# Patient Record
Sex: Male | Born: 1965
Health system: Southern US, Community
[De-identification: ages and names within clinical notes are randomized; demographics above are authoritative.]

## PROBLEM LIST (undated history)

## (undated) DIAGNOSIS — I5031 Acute diastolic (congestive) heart failure: Secondary | ICD-10-CM

## (undated) DIAGNOSIS — I110 Hypertensive heart disease with heart failure: Secondary | ICD-10-CM

## (undated) DIAGNOSIS — I10 Essential (primary) hypertension: Secondary | ICD-10-CM

## (undated) HISTORY — PX: BRAIN SURGERY: SHX531

---

## 1999-02-20 ENCOUNTER — Emergency Department (HOSPITAL_COMMUNITY): Admission: EM | Admit: 1999-02-20 | Discharge: 1999-02-20 | Payer: Self-pay | Admitting: Emergency Medicine

## 2000-11-26 ENCOUNTER — Emergency Department (HOSPITAL_COMMUNITY): Admission: EM | Admit: 2000-11-26 | Discharge: 2000-11-26 | Payer: Self-pay | Admitting: *Deleted

## 2000-12-23 ENCOUNTER — Emergency Department (HOSPITAL_COMMUNITY): Admission: EM | Admit: 2000-12-23 | Discharge: 2000-12-23 | Payer: Self-pay

## 2001-12-14 ENCOUNTER — Emergency Department (HOSPITAL_COMMUNITY): Admission: EM | Admit: 2001-12-14 | Discharge: 2001-12-14 | Payer: Self-pay | Admitting: Emergency Medicine

## 2007-12-29 ENCOUNTER — Emergency Department (HOSPITAL_COMMUNITY): Admission: EM | Admit: 2007-12-29 | Discharge: 2007-12-29 | Payer: Self-pay | Admitting: Emergency Medicine

## 2012-02-26 ENCOUNTER — Emergency Department (INDEPENDENT_AMBULATORY_CARE_PROVIDER_SITE_OTHER)
Admission: EM | Admit: 2012-02-26 | Discharge: 2012-02-26 | Disposition: A | Discharge status: Home / Self Care | Payer: Self-pay | Source: Home / Self Care

## 2012-02-26 ENCOUNTER — Encounter (HOSPITAL_COMMUNITY): Payer: Self-pay | Admitting: Emergency Medicine

## 2012-02-26 ENCOUNTER — Emergency Department (INDEPENDENT_AMBULATORY_CARE_PROVIDER_SITE_OTHER): Payer: Self-pay

## 2012-02-26 DIAGNOSIS — J9801 Acute bronchospasm: Secondary | ICD-10-CM

## 2012-02-26 DIAGNOSIS — J45909 Unspecified asthma, uncomplicated: Secondary | ICD-10-CM

## 2012-02-26 DIAGNOSIS — J069 Acute upper respiratory infection, unspecified: Secondary | ICD-10-CM

## 2012-02-26 MED ORDER — ALBUTEROL SULFATE (5 MG/ML) 0.5% IN NEBU
INHALATION_SOLUTION | RESPIRATORY_TRACT | Status: AC
  Filled 2012-02-26: qty 0.5

## 2012-02-26 MED ORDER — TRIAMCINOLONE ACETONIDE 40 MG/ML IJ SUSP
INTRAMUSCULAR | Status: AC
  Filled 2012-02-26: qty 5

## 2012-02-26 MED ORDER — METHYLPREDNISOLONE 4 MG PO KIT: PACK | ORAL | Status: DC

## 2012-02-26 MED ORDER — TRIAMCINOLONE ACETONIDE 40 MG/ML IJ SUSP
40.0000 mg | Freq: Once | INTRAMUSCULAR | Status: AC
  Administered 2012-02-26: 40 mg via INTRAMUSCULAR

## 2012-02-26 MED ORDER — ALBUTEROL SULFATE (5 MG/ML) 0.5% IN NEBU
2.5000 mg | INHALATION_SOLUTION | Freq: Once | RESPIRATORY_TRACT | Status: AC
  Administered 2012-02-26: 2.5 mg via RESPIRATORY_TRACT

## 2012-02-26 MED ORDER — IPRATROPIUM BROMIDE 0.02 % IN SOLN
0.5000 mg | Freq: Once | RESPIRATORY_TRACT | Status: AC
  Administered 2012-02-26: 0.5 mg via RESPIRATORY_TRACT

## 2012-02-26 MED ORDER — ALBUTEROL SULFATE HFA 108 (90 BASE) MCG/ACT IN AERS: 2.0000 | INHALATION_SPRAY | RESPIRATORY_TRACT | Status: DC | PRN

## 2012-02-26 NOTE — ED Notes (Addendum)
Pt c/o SOB x3 days... Sx include: occasional cough, fevers, chest discomfort w/activity... Denies: nauseas, vomiting, diarrhea, blurry vision, edema... Pt is alert w/some discomfort due to SOB... No hx of asthma or HTN.

## 2012-02-26 NOTE — ED Provider Notes (Signed)
History     CSN: [id number]  Arrival date & time 02/26/12  1813   None     Chief Complaint  Patient presents with  . Shortness of Breath    (Consider location/radiation/quality/duration/timing/severity/associated sxs/prior treatment) HPI Comments: 46 year old male who developed coughing congestion approximately 2 days ago. The cough has improved somewhat however yesterday he purchased some Mucinex and after taking that he developed shortness of breath which has persisted through today. Shortness of breath is getting worse it occurs at rest and especially with exertion. He states that fever 101 yesterday but the cough is better today. He denies any chest pain, denies heaviness, tightness, pressure, or fullness. Although some of his symptoms that occurred initially have improved his chief complaint is dyspnea. Initial pulse ox was 93% he was repeated moments later and was 97%   History reviewed. No pertinent past medical history.  Past Surgical History  Procedure Date  . Brain surgery     No family history on file.  History  Substance Use Topics  . Smoking status: Current Every Day Smoker -- 1.0 packs/day    Types: Cigars  . Smokeless tobacco: Not on file  . Alcohol Use: Yes      Review of Systems  Constitutional: Positive for fever and activity change. Negative for diaphoresis and fatigue.  HENT: Negative for ear pain, sore throat, facial swelling, rhinorrhea, trouble swallowing, neck pain, neck stiffness and postnasal drip.   Eyes: Negative for pain, discharge and redness.  Respiratory: Positive for cough and shortness of breath. Negative for chest tightness.   Cardiovascular: Negative.  Negative for chest pain, palpitations and leg swelling.  Gastrointestinal: Negative.   Musculoskeletal: Negative.   Neurological: Negative.     Allergies  Review of patient's allergies indicates no known allergies.  Home Medications   Current Outpatient Rx  Name  Route  Sig   Dispense  Refill  . ALBUTEROL SULFATE HFA 108 (90 BASE) MCG/ACT IN AERS   Inhalation   Inhale 2 puffs into the lungs every 4 (four) hours as needed for wheezing.   1 Inhaler   0   . METHYLPREDNISOLONE 4 MG PO KIT      As directed   21 tablet   0     BP 164/98  Pulse 85  Temp 99.9 F (37.7 C) (Oral)  Resp 22  SpO2 97%  Physical Exam  Constitutional: He is oriented to person, place, and time. He appears well-developed and well-nourished. No distress.  HENT:  Right Ear: External ear normal.       TMs are pearly gray, trans-lucent, no bulging retraction Oropharynx is mildly erythematous with copious amount of thick gray PND.  Eyes: Conjunctivae normal and EOM are normal.  Neck: Normal range of motion. Neck supple.  Cardiovascular: Normal rate and regular rhythm.   Pulmonary/Chest: He has wheezes. He has no rales.       Mild increase in respiratory effort. Expiratory phase is prolonged with diminished breath sounds and wheezing  Musculoskeletal: Normal range of motion. He exhibits no edema.  Lymphadenopathy:    He has no cervical adenopathy.  Neurological: He is alert and oriented to person, place, and time.  Skin: Skin is warm and dry. No rash noted.  Psychiatric: He has a normal mood and affect.    ED Course  Procedures (including critical care time)  Labs Reviewed - No data to display Dg Chest 2 View  02/26/2012  *RADIOLOGY REPORT*  Clinical Data: Shortness of breath, cough.  CHEST - 2 VIEW  Comparison: None.  Findings: Heart and mediastinal contours are within normal limits. No focal opacities or effusions.  No acute bony abnormality.  IMPRESSION: No active cardiopulmonary disease.   Original Report Authenticated By: Charlett Nose, M.D.      1. Bronchospasm   2. Reactive airway disease with wheezing   3. URI (upper respiratory infection)       MDM  Albuterol and Atrovent nebulizer Post neb evaluation reveals moderate improvement in breathing and reduction of  wheezing. He does not have a history of asthma however he does smoke a cigar once a day. He is advised to stop smoking altogether. I suspect he has some reactive airway disease causing most of his symptoms tonight. Chest x-ray is clear. Kenalog 40 mg IM now Albuterol HFA 2 puffs q. 6 hours when necessary cough and we Medrol Dosepak start tomorrow Oral your physician as directed. Return if worse Blood pressure is elevated but he did come down some on the repeat measurement. Is advised follow up with his physician for evaluation of hypertension.        Hayden Rasmussen, NP 02/26/12 1932  Hayden Rasmussen, NP 02/26/12 [phone]

## 2012-02-28 ENCOUNTER — Emergency Department (HOSPITAL_COMMUNITY): Payer: Self-pay

## 2012-02-28 ENCOUNTER — Encounter (HOSPITAL_COMMUNITY): Payer: Self-pay | Admitting: *Deleted

## 2012-02-28 ENCOUNTER — Emergency Department (INDEPENDENT_AMBULATORY_CARE_PROVIDER_SITE_OTHER)
Admission: EM | Admit: 2012-02-28 | Discharge: 2012-02-28 | Disposition: A | Discharge status: Nursing Home (Includes ICF) | Payer: Self-pay | Source: Home / Self Care | Attending: Family Medicine | Admitting: Family Medicine

## 2012-02-28 ENCOUNTER — Emergency Department (HOSPITAL_COMMUNITY)
Admission: EM | Admit: 2012-02-28 | Discharge: 2012-02-28 | Disposition: A | Discharge status: Home / Self Care | Payer: Self-pay | Attending: Emergency Medicine | Admitting: Emergency Medicine

## 2012-02-28 DIAGNOSIS — R109 Unspecified abdominal pain: Secondary | ICD-10-CM

## 2012-02-28 DIAGNOSIS — R52 Pain, unspecified: Secondary | ICD-10-CM

## 2012-02-28 DIAGNOSIS — R1013 Epigastric pain: Secondary | ICD-10-CM

## 2012-02-28 DIAGNOSIS — I10 Essential (primary) hypertension: Secondary | ICD-10-CM

## 2012-02-28 DIAGNOSIS — Z8719 Personal history of other diseases of the digestive system: Secondary | ICD-10-CM | POA: Insufficient documentation

## 2012-02-28 DIAGNOSIS — M549 Dorsalgia, unspecified: Secondary | ICD-10-CM | POA: Insufficient documentation

## 2012-02-28 DIAGNOSIS — Z79899 Other long term (current) drug therapy: Secondary | ICD-10-CM | POA: Insufficient documentation

## 2012-02-28 DIAGNOSIS — F17200 Nicotine dependence, unspecified, uncomplicated: Secondary | ICD-10-CM | POA: Insufficient documentation

## 2012-02-28 DIAGNOSIS — I169 Hypertensive crisis, unspecified: Secondary | ICD-10-CM

## 2012-02-28 HISTORY — DX: Essential (primary) hypertension: I10

## 2012-02-28 LAB — POCT I-STAT, CHEM 8
BUN: 18 mg/dL (ref 6–23)
Creatinine, Ser: 1 mg/dL (ref 0.50–1.35)
Glucose, Bld: 136 mg/dL — ABNORMAL HIGH (ref 70–99)
Hemoglobin: 17 g/dL (ref 13.0–17.0)
Potassium: 3.9 mEq/L (ref 3.5–5.1)
Sodium: 144 mEq/L (ref 135–145)

## 2012-02-28 LAB — POCT URINALYSIS DIP (DEVICE)
Bilirubin Urine: NEGATIVE
Nitrite: NEGATIVE
Protein, ur: 100 mg/dL — AB
Specific Gravity, Urine: >=1.03 (ref 1.005–1.030)

## 2012-02-28 LAB — COMPREHENSIVE METABOLIC PANEL
ALT: 45 U/L (ref 0–53)
ALT: 42 U/L (ref 0–53)
AST: 53 U/L — ABNORMAL HIGH (ref 0–37)
AST: 48 U/L — ABNORMAL HIGH (ref 0–37)
Albumin: 4.1 g/dL (ref 3.5–5.2)
BUN: 16 mg/dL (ref 6–23)
Chloride: 104 mEq/L (ref 96–112)
GFR calc Af Amer: >90 mL/min (ref >90)
GFR calc non Af Amer: >90 mL/min (ref >90)
Glucose, Bld: 142 mg/dL — ABNORMAL HIGH (ref 70–99)
Potassium: 3.6 mEq/L (ref 3.5–5.1)
Potassium: 3.7 mEq/L (ref 3.5–5.1)
Sodium: 142 mEq/L (ref 135–145)
Total Bilirubin: 0.2 mg/dL — ABNORMAL LOW (ref 0.3–1.2)
Total Protein: 7.3 g/dL (ref 6.0–8.3)

## 2012-02-28 LAB — CBC: Hemoglobin: 15.3 g/dL (ref 13.0–17.0)

## 2012-02-28 LAB — OCCULT BLOOD, POC DEVICE: Fecal Occult Bld: NEGATIVE

## 2012-02-28 LAB — TROPONIN I: Troponin I: <0.3 ng/mL (ref <0.30)

## 2012-02-28 MED ORDER — IOHEXOL 300 MG/ML  SOLN
100.0000 mL | Freq: Once | INTRAMUSCULAR | Status: AC | PRN
  Administered 2012-02-28: 100 mL via INTRAVENOUS

## 2012-02-28 MED ORDER — HYDROMORPHONE HCL PF 1 MG/ML IJ SOLN
INTRAMUSCULAR | Status: AC
  Filled 2012-02-28: qty 1

## 2012-02-28 MED ORDER — HYDROMORPHONE HCL PF 1 MG/ML IJ SOLN
1.0000 mg | Freq: Once | INTRAMUSCULAR | Status: AC
  Administered 2012-02-28: 1 mg via INTRAVENOUS
  Filled 2012-02-28: qty 1

## 2012-02-28 MED ORDER — GI COCKTAIL ~~LOC~~
ORAL | Status: AC
  Filled 2012-02-28: qty 30

## 2012-02-28 MED ORDER — ONDANSETRON HCL 4 MG/2ML IJ SOLN
INTRAMUSCULAR | Status: AC
  Filled 2012-02-28: qty 2

## 2012-02-28 MED ORDER — HYDROMORPHONE HCL 1 MG/ML IJ SOLN
0.5000 mg | Freq: Once | INTRAMUSCULAR | Status: AC
  Administered 2012-02-28: 0.5 mg via INTRAVENOUS

## 2012-02-28 MED ORDER — IOHEXOL 300 MG/ML  SOLN
20.0000 mL | INTRAMUSCULAR | Status: AC
  Administered 2012-02-28 (×2): 20 mL via ORAL

## 2012-02-28 MED ORDER — OXYCODONE-ACETAMINOPHEN 5-325 MG PO TABS: 1.0000 | ORAL_TABLET | Freq: Four times a day (QID) | ORAL | Status: DC | PRN

## 2012-02-28 MED ORDER — SODIUM CHLORIDE 0.9 % IV BOLUS (SEPSIS)
1000.0000 mL | Freq: Once | INTRAVENOUS | Status: AC
  Administered 2012-02-28: 1000 mL via INTRAVENOUS

## 2012-02-28 MED ORDER — HYDROMORPHONE HCL 1 MG/ML IJ SOLN: 0.5000 mg | Freq: Once | INTRAMUSCULAR | Status: DC

## 2012-02-28 MED ORDER — ONDANSETRON HCL 4 MG/2ML IJ SOLN
4.0000 mg | Freq: Once | INTRAMUSCULAR | Status: AC
  Administered 2012-02-28: 4 mg via INTRAVENOUS

## 2012-02-28 MED ORDER — ONDANSETRON 4 MG PO TBDP: 4.0000 mg | ORAL_TABLET | Freq: Once | ORAL | Status: DC

## 2012-02-28 MED ORDER — MORPHINE SULFATE 4 MG/ML IJ SOLN: 4.0000 mg | Freq: Once | INTRAMUSCULAR | Status: DC

## 2012-02-28 NOTE — ED Notes (Signed)
Pt    Feels  Somewhat  Better          Remains  Npo

## 2012-02-28 NOTE — ED Notes (Signed)
SPOKE WITH DANA IN CT. SHE ADVISES SHE HAS PUT PT IN SYSTEM TO BE TRANSPORTED TO CT

## 2012-02-28 NOTE — ED Provider Notes (Signed)
CT results reviewed, discussed with Dr. Deretha Emory, shared with patient.  Patient tolerating po fluids without difficulty. Plan is to discharge patient home with instructions for clear liquid diet for 12-24 hours, and progression of diet as tolerated.  GI referral provided.  Jimmye Norman, NP 02/28/12 2216

## 2012-02-28 NOTE — ED Notes (Signed)
Iv  Ns  tko  20  Angio  r  Hand  1  Att     Site  patent

## 2012-02-28 NOTE — ED Notes (Signed)
np aware pt complaint of pain

## 2012-02-28 NOTE — ED Notes (Signed)
Patient states no real nausea/vomiting or diarrhea, but had horrible 10/10 abdominal pain this morning that radiate to back.

## 2012-02-28 NOTE — ED Provider Notes (Signed)
History     CSN: [id number]  Arrival date & time 02/28/12  1207   First MD Initiated Contact with Patient 02/28/12 1257      Chief Complaint  Patient presents with  . Abdominal Pain    (Consider location/radiation/quality/duration/timing/severity/associated sxs/prior treatment) HPI Comments: Pt presents from [facility] for further evaluation of acute sudden onset of epigastric abdominal pain at 8:30 AM after taking prednisone (tues dx w bronchitis associated w SOB, started taper yesterday. Given albuterol inhaler as well). Last meal was cereal this morning. Pt also recently diagnosed w HTN, not yet evaluated by PCP or on medication. LNBM this morning.   Patient is a 46 y.o. male presenting with abdominal pain. The history is provided by the patient.  Abdominal Pain The primary symptoms of the illness include abdominal pain. The primary symptoms of the illness do not include fever, fatigue, shortness of breath, nausea, vomiting, diarrhea, hematemesis, hematochezia, dysuria, vaginal discharge or vaginal bleeding. The current episode started 3 to 5 hours ago. The onset of the illness was sudden. The problem has not changed since onset. The illness is associated with alcohol use. Additional symptoms associated with the illness include back pain. Symptoms associated with the illness do not include chills, anorexia, diaphoresis, heartburn, constipation, urgency, hematuria or frequency. Significant associated medical issues include GERD. Significant associated medical issues do not include PUD, inflammatory bowel disease, diabetes, sickle cell disease, gallstones, liver disease, substance abuse, diverticulitis, HIV or cardiac disease.    Past Medical History  Diagnosis Date  . Hypertension     Past Surgical History  Procedure Date  . Brain surgery     History reviewed. No pertinent family history.  History  Substance Use Topics  . Smoking status: Current Every Day Smoker -- 1.0 packs/day     Types: Cigars  . Smokeless tobacco: Not on file  . Alcohol Use: Yes      Review of Systems  Constitutional: Negative for fever, chills, diaphoresis, appetite change and fatigue.  HENT: Negative for congestion.   Eyes: Negative for visual disturbance.  Respiratory: Negative for shortness of breath.   Cardiovascular: Negative for chest pain and leg swelling.  Gastrointestinal: Positive for abdominal pain. Negative for heartburn, nausea, vomiting, diarrhea, constipation, hematochezia, anorexia and hematemesis.  Genitourinary: Negative for dysuria, urgency, frequency, hematuria, vaginal bleeding and vaginal discharge.  Musculoskeletal: Positive for back pain.  Neurological: Negative for dizziness, syncope, weakness, light-headedness, numbness and headaches.  Psychiatric/Behavioral: Negative for confusion.  All other systems reviewed and are negative.    Allergies  Review of patient's allergies indicates no known allergies.  Home Medications   Current Outpatient Rx  Name  Route  Sig  Dispense  Refill  . ALBUTEROL SULFATE HFA 108 (90 BASE) MCG/ACT IN AERS   Inhalation   Inhale 2 puffs into the lungs every 4 (four) hours as needed for wheezing.   1 Inhaler   0   . GUAIFENESIN ER 600 MG PO TB12   Oral   Take 600 mg by mouth 2 (two) times daily as needed. congestion         . METHYLPREDNISOLONE 4 MG PO KIT      As directed   21 tablet   0     BP 147/106  Pulse 70  Temp 98.2 F (36.8 C) (Oral)  Resp 20  SpO2 93%  Physical Exam  Nursing note and vitals reviewed. Constitutional: Vital signs are normal. He appears well-developed and well-nourished. He appears distressed.  HENT:  Head:  Normocephalic and atraumatic.  Mouth/Throat: Uvula is midline, oropharynx is clear and moist and mucous membranes are normal.  Eyes: Conjunctivae normal and EOM are normal. Pupils are equal, round, and reactive to light.  Neck: Normal range of motion and full passive range of motion  without pain. Neck supple. No spinous process tenderness and no muscular tenderness present. No rigidity. No Brudzinski's sign noted.  Cardiovascular: Normal rate and regular rhythm.   Pulmonary/Chest: Effort normal and breath sounds normal. No accessory muscle usage. Not tachypneic. No respiratory distress.  Abdominal: Soft. Normal appearance. He exhibits no distension, no ascites, no pulsatile midline mass and no mass. There is tenderness. There is no CVA tenderness. No hernia.       Moderate epigastric tenderness to palpation. No masses or pulsatile aorta  Genitourinary: Guaiac negative stool (Per U[facility]).  Lymphadenopathy:    He has no cervical adenopathy.  Neurological: He is alert.  Skin: Skin is warm and dry. No rash noted. He is not diaphoretic.  Psychiatric: He has a normal mood and affect. His speech is normal and behavior is normal.    ED Course  Procedures (including critical care time)  Labs Reviewed  COMPREHENSIVE METABOLIC PANEL - Abnormal; Notable for the following:    Glucose, Bld 127 (*)     AST 48 (*)     Total Bilirubin 0.2 (*)     All other components within normal limits  LIPASE, BLOOD - Abnormal; Notable for the following:    Lipase 83 (*)     All other components within normal limits  CBC - Abnormal; Notable for the following:    RDW 15.8 (*)     All other components within normal limits  TROPONIN I   Dg Chest 2 View  02/26/2012  *RADIOLOGY REPORT*  Clinical Data: Shortness of breath, cough.  CHEST - 2 VIEW  Comparison: None.  Findings: Heart and mediastinal contours are within normal limits. No focal opacities or effusions.  No acute bony abnormality.  IMPRESSION: No active cardiopulmonary disease.   Original Report Authenticated By: Charlett Nose, M.D.    US Abdomen Complete  02/28/2012  *RADIOLOGY REPORT*  Clinical Data:  Epigastric pain.  ABDOMINAL ULTRASOUND COMPLETE  Comparison:  None.  Findings:  Gallbladder:  No gallstones, gallbladder wall thickening,  or pericholecystic fluid. The sonographic Murphy's sign is negative.  Common Bile Duct:  Within normal limits in caliber.  Liver: No focal mass lesion identified.  Within normal limits in parenchymal echogenicity. The portal vein is patent and demonstrates normal direction of flow.  IVC:  Appears normal.  Pancreas:  No abnormality identified.  Spleen:  Within normal limits in size and echotexture.  Right kidney:  Normal in size and parenchymal echogenicity.  No evidence of mass or hydronephrosis.  Left kidney:  Normal in size and parenchymal echogenicity.  No evidence hydronephrosis. There is a 1.7 x 1.4 x 1.6 cm cyst in the cortex of the mid pole.  Abdominal Aorta:  No aneurysm identified.  IMPRESSION:  1.  No acute findings.  No explanation for patient's epigastric pain is identified. 2.  Incidental 1.7 cm left renal cyst.   Original Report Authenticated By: Britta Mccreedy, M.D.     Date: 02/28/2012  Rate: 60  Rhythm: normal sinus rhythm  QRS Axis: normal  Intervals: normal  ST/T Wave abnormalities: normal  Conduction Disutrbances: none  Narrative Interpretation:   Old EKG Reviewed: No significant changes noted     No diagnosis found.    MDM  Epigastric abd pain, hypertension   Pt w recent dx of HTN presents to ER c/o acute onset epigastric pain. Labs adn imaging reviewed this far showing elevated lipase. No indication of biliary etiology on [place]. CT abd pelvis pending for further evaluation of possible pancreatic etiology vs prednisone induced gastritis/pancreatitis. Pt is afebrile with BP 147/106  Pulse 70  Temp 98.2 F (36.8 C) (Oral)  Resp 20  SpO2 93%. Pain being managed in the ER. Transfer to CDU pending disposition once resulted. CAse discussed with CDU provider which is agreeable with  Move.         Jaci Carrel, [place] 02/28/12 1431

## 2012-02-28 NOTE — ED Notes (Signed)
Pt  Reports    Severe  Epigastric /  abd  Pain  Which  Started  This  Am           Pt  Reports  The  Pain radiates  To  His  Back          He  Was  Seen  sev  Days  Ago  For  Bronchitis             Pt       denys  Any       Nausea  Vomiting   Or  Diarrhea         He  Has  A   History     Of       htn  But     He       Takes  No  meds

## 2012-02-28 NOTE — ED Notes (Signed)
Patient sent from [facility] for abd pain.  Had 0.5 of Dilaudid and 4 mg of Zofran IV with no relief at [facility].  Sent for further eval of abd pain.  Patient states "pain did not start until I took a Prednisone".

## 2012-02-28 NOTE — ED Provider Notes (Signed)
History     CSN: [id number]  Arrival date & time 02/28/12  1012   First MD Initiated Contact with Patient 02/28/12 1024      Chief Complaint  Patient presents with  . Abdominal Pain    (Consider location/radiation/quality/duration/timing/severity/associated sxs/prior treatment) HPI Comments: 46 year old smoker male smoker with history of high blood pressure. Here complaining of epigastric pain constant for about an hour. Patient was diagnosed with bronchitis 2 days ago and has been taking prednisone for the last 2 days. He states his pain started 30 minutes after his prednisone dose this morning pain is stabbing and was associated with a cold sweat and nausea initially. No diaphoresis currently. . Denies recent alcohol intake. Denies nausea or vomiting. Last bowel movement was this morning brown stools with no blood or melena. No dysuria or hematuria. States epigastric pain radiates to the back. No prior history of pancreatitis or gallbladder stones. Patient does not currently take any medications for blood pressure. Denies headache, visual changes, chest pain or shortness of breath.   Past Medical History  Diagnosis Date  . Hypertension     Past Surgical History  Procedure Date  . Brain surgery     No family history on file.  History  Substance Use Topics  . Smoking status: Current Every Day Smoker -- 1.0 packs/day    Types: Cigars  . Smokeless tobacco: Not on file  . Alcohol Use: Yes      Review of Systems  Constitutional: Negative for fever, chills and diaphoresis.  Eyes: Negative for visual disturbance.  Respiratory: Negative for cough and shortness of breath.   Cardiovascular: Negative for chest pain, palpitations and leg swelling.  Gastrointestinal: Positive for nausea and abdominal pain. Negative for vomiting, diarrhea and blood in stool.  Genitourinary: Negative for dysuria, hematuria and flank pain.  Skin: Negative for rash.  Neurological: Negative for  dizziness, weakness, numbness and headaches.    Allergies  Review of patient's allergies indicates no known allergies.  Home Medications   Current Outpatient Rx  Name  Route  Sig  Dispense  Refill  . ALBUTEROL SULFATE HFA 108 (90 BASE) MCG/ACT IN AERS   Inhalation   Inhale 2 puffs into the lungs every 4 (four) hours as needed for wheezing.   1 Inhaler   0   . METHYLPREDNISOLONE 4 MG PO KIT      As directed   21 tablet   0     BP 198/116  Pulse 88  Temp 99.2 F (37.3 C)  SpO2 100%  Physical Exam  Nursing note and vitals reviewed. Constitutional: He is oriented to person, place, and time. He appears well-developed and well-nourished.       Uncomfortable bending due to pain.  HENT:  Head: Normocephalic and atraumatic.  Mouth/Throat: Oropharynx is clear and moist.  Eyes: Conjunctivae normal are normal. No scleral icterus.  Neck: Neck supple. No JVD present. No thyromegaly present.  Cardiovascular: Normal heart sounds.   Pulmonary/Chest: Breath sounds normal.  Abdominal: Soft. He exhibits no mass.       Obese abdomen. Severe epigastric tenderness with voluntary guarding. Otherwise rest of abdomen is soft with no TTP and no rebound. Decreased bowel sounds. No pulsatile mass.  Genitourinary: Rectum normal. Guaiac negative stool.  Neurological: He is alert and oriented to person, place, and time.  Skin: No rash noted.    ED Course  Procedures (including critical care time)  Labs Reviewed  POCT URINALYSIS DIP (DEVICE) - Abnormal; Notable for the  following:    Hgb urine dipstick SMALL (*)     Protein, ur 100 (*)     All other components within normal limits  OCCULT BLOOD, POC DEVICE  LIPASE, BLOOD  COMPREHENSIVE METABOLIC PANEL      1. Acute epigastric pain   2. Hypertensive crisis    EKG: Sinus bradycardia with a ventricular rate 59 beats per minutes no acute ischemic changes.   MDM  46 year old male smoker with history of hypertension. Not on  antihypertensive medications. Here complaining of constant severe epigastric pain for over an hour. Pain associated with cold sweat, nausea but no vomiting. No diarrhea no melena.On exam: Uncomfortable with pain. Hypertensive with a blood pressure in 198/116, pulse in the 80s. Abdomen severe epigastric tenderness with voluntary guarding. Rest of the abdomen is soft with no rebound, no masses. Guaiac negative. Possible steroid related gastritis but pain is persistent with no improvement of pain after GI cocktail. Patient very uncomfortable and hypertensive concerning for aortic pathology. Decided to transfer to the emergency department for further evaluation and management. Patient had a lot at 0.5 mg IV x1 and ondansetron 4 mg IV x1 prior to transfer to the emergency department. I stat and lipase pending at the time of discharge.         Sharin Grave, MD 02/28/12 [phone]

## 2012-02-29 NOTE — ED Provider Notes (Signed)
Medical screening examination/treatment/procedure(s) were performed by resident physician or non-physician practitioner and as supervising physician I was immediately available for consultation/collaboration.   KINDL,JAMES DOUGLAS MD.    James D Kindl, MD 02/29/12 1127 

## 2012-02-29 NOTE — ED Provider Notes (Signed)
Medical screening examination/treatment/procedure(s) were performed by non-physician practitioner and as supervising physician I was immediately available for consultation/collaboration.   Joya Gaskins, MD 02/29/12 [phone]

## 2012-03-01 NOTE — ED Provider Notes (Signed)
Medical screening examination/treatment/procedure(s) were conducted as a shared visit with non-physician practitioner(s) and myself.  I personally evaluated the patient during the encounter  Patient seen and findings reviewed. Stable for discharge.  Results for orders placed during the hospital encounter of 02/28/12  COMPREHENSIVE METABOLIC PANEL      Component Value Range   Sodium 142  135 - 145 mEq/L   Potassium 3.7  3.5 - 5.1 mEq/L   Chloride 104  96 - 112 mEq/L   CO2 28  19 - 32 mEq/L   Glucose, Bld 127 (*) 70 - 99 mg/dL   BUN 17  6 - 23 mg/dL   Creatinine, Ser 1.61  0.50 - 1.35 mg/dL   Calcium 9.3  8.4 - 09.6 mg/dL   Total Protein 7.3  6.0 - 8.3 g/dL   Albumin 3.8  3.5 - 5.2 g/dL   AST 48 (*) 0 - 37 U/L   ALT 42  0 - 53 U/L   Alkaline Phosphatase 86  39 - 117 U/L   Total Bilirubin 0.2 (*) 0.3 - 1.2 mg/dL   GFR calc non Af Amer >90  >90 mL/min   GFR calc Af Amer >90  >90 mL/min  LIPASE, BLOOD      Component Value Range   Lipase 83 (*) 11 - 59 U/L  CBC      Component Value Range   WBC 5.0  4.0 - 10.5 K/uL   RBC 5.13  4.22 - 5.81 MIL/uL   Hemoglobin 15.3  13.0 - 17.0 g/dL   HCT 04.5  40.9 - 81.1 %   MCV 90.1  78.0 - 100.0 fL   MCH 29.8  26.0 - 34.0 pg   MCHC 33.1  30.0 - 36.0 g/dL   RDW 91.4 (*) 78.2 - 95.6 %   Platelets 247  150 - 400 K/uL  TROPONIN I      Component Value Range   Troponin I <0.30  <0.30 ng/mL   Results for orders placed during the hospital encounter of 02/28/12  COMPREHENSIVE METABOLIC PANEL      Component Value Range   Sodium 142  135 - 145 mEq/L   Potassium 3.7  3.5 - 5.1 mEq/L   Chloride 104  96 - 112 mEq/L   CO2 28  19 - 32 mEq/L   Glucose, Bld 127 (*) 70 - 99 mg/dL   BUN 17  6 - 23 mg/dL   Creatinine, Ser 2.13  0.50 - 1.35 mg/dL   Calcium 9.3  8.4 - 08.6 mg/dL   Total Protein 7.3  6.0 - 8.3 g/dL   Albumin 3.8  3.5 - 5.2 g/dL   AST 48 (*) 0 - 37 U/L   ALT 42  0 - 53 U/L   Alkaline Phosphatase 86  39 - 117 U/L   Total Bilirubin 0.2 (*)  0.3 - 1.2 mg/dL   GFR calc non Af Amer >90  >90 mL/min   GFR calc Af Amer >90  >90 mL/min  LIPASE, BLOOD      Component Value Range   Lipase 83 (*) 11 - 59 U/L  CBC      Component Value Range   WBC 5.0  4.0 - 10.5 K/uL   RBC 5.13  4.22 - 5.81 MIL/uL   Hemoglobin 15.3  13.0 - 17.0 g/dL   HCT 57.8  46.9 - 62.9 %   MCV 90.1  78.0 - 100.0 fL   MCH 29.8  26.0 - 34.0 pg  MCHC 33.1  30.0 - 36.0 g/dL   RDW 40.9 (*) 81.1 - 91.4 %   Platelets 247  150 - 400 K/uL  TROPONIN I      Component Value Range   Troponin I <0.30  <0.30 ng/mL   Dg Chest 2 View  02/26/2012  *RADIOLOGY REPORT*  Clinical Data: Shortness of breath, cough.  CHEST - 2 VIEW  Comparison: None.  Findings: Heart and mediastinal contours are within normal limits. No focal opacities or effusions.  No acute bony abnormality.  IMPRESSION: No active cardiopulmonary disease.   Original Report Authenticated By: Charlett Nose, M.D.    US Abdomen Complete  02/28/2012  *RADIOLOGY REPORT*  Clinical Data:  Epigastric pain.  ABDOMINAL ULTRASOUND COMPLETE  Comparison:  None.  Findings:  Gallbladder:  No gallstones, gallbladder wall thickening, or pericholecystic fluid. The sonographic Murphy's sign is negative.  Common Bile Duct:  Within normal limits in caliber.  Liver: No focal mass lesion identified.  Within normal limits in parenchymal echogenicity. The portal vein is patent and demonstrates normal direction of flow.  IVC:  Appears normal.  Pancreas:  No abnormality identified.  Spleen:  Within normal limits in size and echotexture.  Right kidney:  Normal in size and parenchymal echogenicity.  No evidence of mass or hydronephrosis.  Left kidney:  Normal in size and parenchymal echogenicity.  No evidence hydronephrosis. There is a 1.7 x 1.4 x 1.6 cm cyst in the cortex of the mid pole.  Abdominal Aorta:  No aneurysm identified.  IMPRESSION:  1.  No acute findings.  No explanation for patient's epigastric pain is identified. 2.  Incidental 1.7 cm  left renal cyst.   Original Report Authenticated By: Britta Mccreedy, M.D.    Ct Abdomen Pelvis W Contrast  02/28/2012  *RADIOLOGY REPORT*  Clinical Data: Epigastric abdominal pain.  Recent shortness of breath.  CT ABDOMEN AND PELVIS WITH CONTRAST  Technique:  Multidetector CT imaging of the abdomen and pelvis was performed following the standard protocol during bolus administration of intravenous contrast.  Contrast: OMNIPAQUE IOHEXOL 300 MG/ML  SOLN  Comparison: 02/28/2012 ultrasound.  Findings: Slight left ventricular prominence noted with apparent apical thinning, correlate with cardiac history.  Nonspecific 7 mm hypodense lesion, segment three of the liver.  Nonspecific 4 mm hypodense lesion, segment eight of the liver.  The spleen, pancreas, and adrenal glands appear normal.  Dilated extrahepatic biliary system noted.  Somewhat fusiform prominence of the common hepatic duct is suspected up to 1.8 cm, and the common bile duct measures about 0.7 cm in diameter.  Hypodense left mid kidney lesion, 1.6 cm, likely a cyst based on low density.  Kidneys otherwise unremarkable.  No pathologic retroperitoneal or porta hepatis adenopathy is identified.  Stomach and duodenum unremarkable.  No hiatal hernia noted.  The appendix appears normal.  No pathologic pelvic adenopathy is identified.  No ventral hernia noted.  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  There is incidental failure of fusion of the posterior elements of T12 along with mild degenerative posterior subluxation at L5-S1.  IMPRESSION:  1.  Dilated common bile duct and common hepatic duct, without intrahepatic biliary dilatation.  No directly visualized choledocholithiasis.  The appearance could be due to low-level distal obstruction or a type 1 choledochal cyst. 2.  Mild left ventricular prominence noted with apical thinning, correlate with cardiac history in determining the need for further workup.  3.  There are two hypodense lesions in the  liver which are statistically highly likely to be benign, but technically nonspecific due to small size. 4.  Small left renal cyst. 5.  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 6.  Mild degenerative posterior subluxation at L5 S1.   Original Report Authenticated By: Gaylyn Rong, M.D.       Shelda Jakes, MD 03/01/12 1201

## 2014-10-29 ENCOUNTER — Emergency Department (INDEPENDENT_AMBULATORY_CARE_PROVIDER_SITE_OTHER)
Admission: EM | Admit: 2014-10-29 | Discharge: 2014-10-29 | Disposition: A | Discharge status: Home / Self Care | Payer: Self-pay | Source: Home / Self Care | Attending: Family Medicine | Admitting: Family Medicine

## 2014-10-29 ENCOUNTER — Encounter (HOSPITAL_COMMUNITY): Payer: Self-pay | Admitting: Emergency Medicine

## 2014-10-29 ENCOUNTER — Emergency Department (INDEPENDENT_AMBULATORY_CARE_PROVIDER_SITE_OTHER): Payer: Self-pay

## 2014-10-29 DIAGNOSIS — J45901 Unspecified asthma with (acute) exacerbation: Secondary | ICD-10-CM

## 2014-10-29 MED ORDER — IPRATROPIUM BROMIDE 0.02 % IN SOLN
0.5000 mg | Freq: Once | RESPIRATORY_TRACT | Status: AC
  Administered 2014-10-29: 0.5 mg via RESPIRATORY_TRACT

## 2014-10-29 MED ORDER — METHYLPREDNISOLONE SODIUM SUCC 125 MG IJ SOLR
125.0000 mg | Freq: Once | INTRAMUSCULAR | Status: AC
  Administered 2014-10-29: 125 mg via INTRAMUSCULAR

## 2014-10-29 MED ORDER — ALBUTEROL SULFATE (2.5 MG/3ML) 0.083% IN NEBU
INHALATION_SOLUTION | RESPIRATORY_TRACT | Status: AC
  Filled 2014-10-29: qty 3

## 2014-10-29 MED ORDER — METHYLPREDNISOLONE SODIUM SUCC 125 MG IJ SOLR
INTRAMUSCULAR | Status: AC
  Filled 2014-10-29: qty 2

## 2014-10-29 MED ORDER — LEVOFLOXACIN 500 MG PO TABS: 500.0000 mg | ORAL_TABLET | Freq: Every day | ORAL | Status: DC

## 2014-10-29 MED ORDER — ALBUTEROL SULFATE HFA 108 (90 BASE) MCG/ACT IN AERS
1.0000 | INHALATION_SPRAY | Freq: Four times a day (QID) | RESPIRATORY_TRACT | Status: DC | PRN
Start: 2014-10-29 — End: 2017-02-24

## 2014-10-29 MED ORDER — IPRATROPIUM-ALBUTEROL 0.5-2.5 (3) MG/3ML IN SOLN
RESPIRATORY_TRACT | Status: AC
  Filled 2014-10-29: qty 3

## 2014-10-29 MED ORDER — ALBUTEROL SULFATE (2.5 MG/3ML) 0.083% IN NEBU
5.0000 mg | INHALATION_SOLUTION | Freq: Once | RESPIRATORY_TRACT | Status: AC
  Administered 2014-10-29: 5 mg via RESPIRATORY_TRACT

## 2014-10-29 NOTE — Discharge Instructions (Signed)
Take all of medicine, drink lots of fluids, no more smoking, see your doctor if further problems  °

## 2014-10-29 NOTE — ED Provider Notes (Signed)
CSN: [id number]     Arrival date & time 10/29/14  1627 History   First MD Initiated Contact with Patient 10/29/14 1736     Chief Complaint  Patient presents with  . Shortness of Breath   (Consider location/radiation/quality/duration/timing/severity/associated sxs/prior Treatment) Patient is a 49 y.o. male presenting with shortness of breath. The history is provided by the patient.  Shortness of Breath Severity:  Moderate Onset quality:  Gradual Duration:  1 day Progression:  Worsening Chronicity:  Recurrent Context comment:  Smoker, h/o similar bronchitis problem last year. Relieved by:  None tried Worsened by:  Smoke exposure Ineffective treatments:  None tried Associated symptoms: fever and wheezing     Past Medical History  Diagnosis Date  . Hypertension    Past Surgical History  Procedure Laterality Date  . Brain surgery     No family history on file. Social History  Substance Use Topics  . Smoking status: Current Every Day Smoker -- 1.00 packs/day    Types: Cigars  . Smokeless tobacco: None  . Alcohol Use: Yes    Review of Systems  Constitutional: Positive for fever and chills.  Respiratory: Positive for shortness of breath and wheezing.   Gastrointestinal: Negative.   Musculoskeletal: Positive for myalgias.    Allergies  Review of patient's allergies indicates no known allergies.  Home Medications   Prior to Admission medications   Medication Sig Start Date End Date Taking? Authorizing Provider  albuterol (PROVENTIL HFA;VENTOLIN HFA) 108 (90 BASE) MCG/ACT inhaler Inhale 1-2 puffs into the lungs every 6 (six) hours as needed for wheezing or shortness of breath. 10/29/14   Linna Hoff, MD  guaiFENesin (MUCINEX) 600 MG 12 hr tablet Take 600 mg by mouth 2 (two) times daily as needed. congestion    Historical Provider, MD  levofloxacin (LEVAQUIN) 500 MG tablet Take 1 tablet (500 mg total) by mouth daily. 10/29/14   Linna Hoff, MD  methylPREDNISolone (MEDROL  DOSEPAK) 4 MG tablet As directed Patient not taking: Reported on 10/29/2014 02/26/12   Hayden Rasmussen, NP  oxyCODONE-acetaminophen (PERCOCET/ROXICET) 5-325 MG per tablet Take 1 tablet by mouth every 6 (six) hours as needed for pain. 02/28/12   Felicie Morn, NP   BP 195/113 mmHg  Pulse 88  Temp(Src) 99.6 F (37.6 C) (Oral)  Resp 16  SpO2 95% Physical Exam  Constitutional: He is oriented to person, place, and time. He appears well-developed and well-nourished.  HENT:  Mouth/Throat: Oropharynx is clear and moist.  Eyes: Conjunctivae are normal. Pupils are equal, round, and reactive to light.  Neck: Normal range of motion. Neck supple.  Cardiovascular: Regular rhythm, normal heart sounds and intact distal pulses.   Pulmonary/Chest: No respiratory distress. He has wheezes.  Abdominal: Soft. Bowel sounds are normal. There is no tenderness.  Lymphadenopathy:    He has no cervical adenopathy.  Neurological: He is alert and oriented to person, place, and time.  Skin: Skin is warm and dry.  Nursing note and vitals reviewed.   ED Course  Procedures (including critical care time) Labs Review Labs Reviewed - No data to display  Imaging Review Dg Chest 2 View  10/29/2014   CLINICAL DATA:  Shortness of breath, cough, chills and body aches beginning 10/28/2014.  EXAM: CHEST  2 VIEW  COMPARISON:  PA and lateral chest 02/26/2012.  FINDINGS: The lungs are clear. Heart size is normal. No pneumothorax or pleural effusion. No focal bony abnormality.  IMPRESSION: Negative chest.   Electronically Signed   By: Maisie Fus  Dalessio M.D.   On: 10/29/2014 18:33     MDM   1. Asthmatic bronchitis with acute exacerbation     Lungs clear and sx improved at d/c after neb.   Linna Hoff, MD 10/29/14 [phone]

## 2014-10-29 NOTE — ED Notes (Signed)
Complains of coughing, chills, generalized aching and sob.  Symptoms started 8/18

## 2016-08-29 IMAGING — DX DG CHEST 2V
2 series · 2 of 2 positions shown · non-contrast
Comparison: PA and lateral chest 02/26/2012.

CLINICAL DATA: Shortness of breath, cough, chills and body aches
beginning 10/28/2014.

EXAM:
CHEST  2 VIEW

[chest pa]
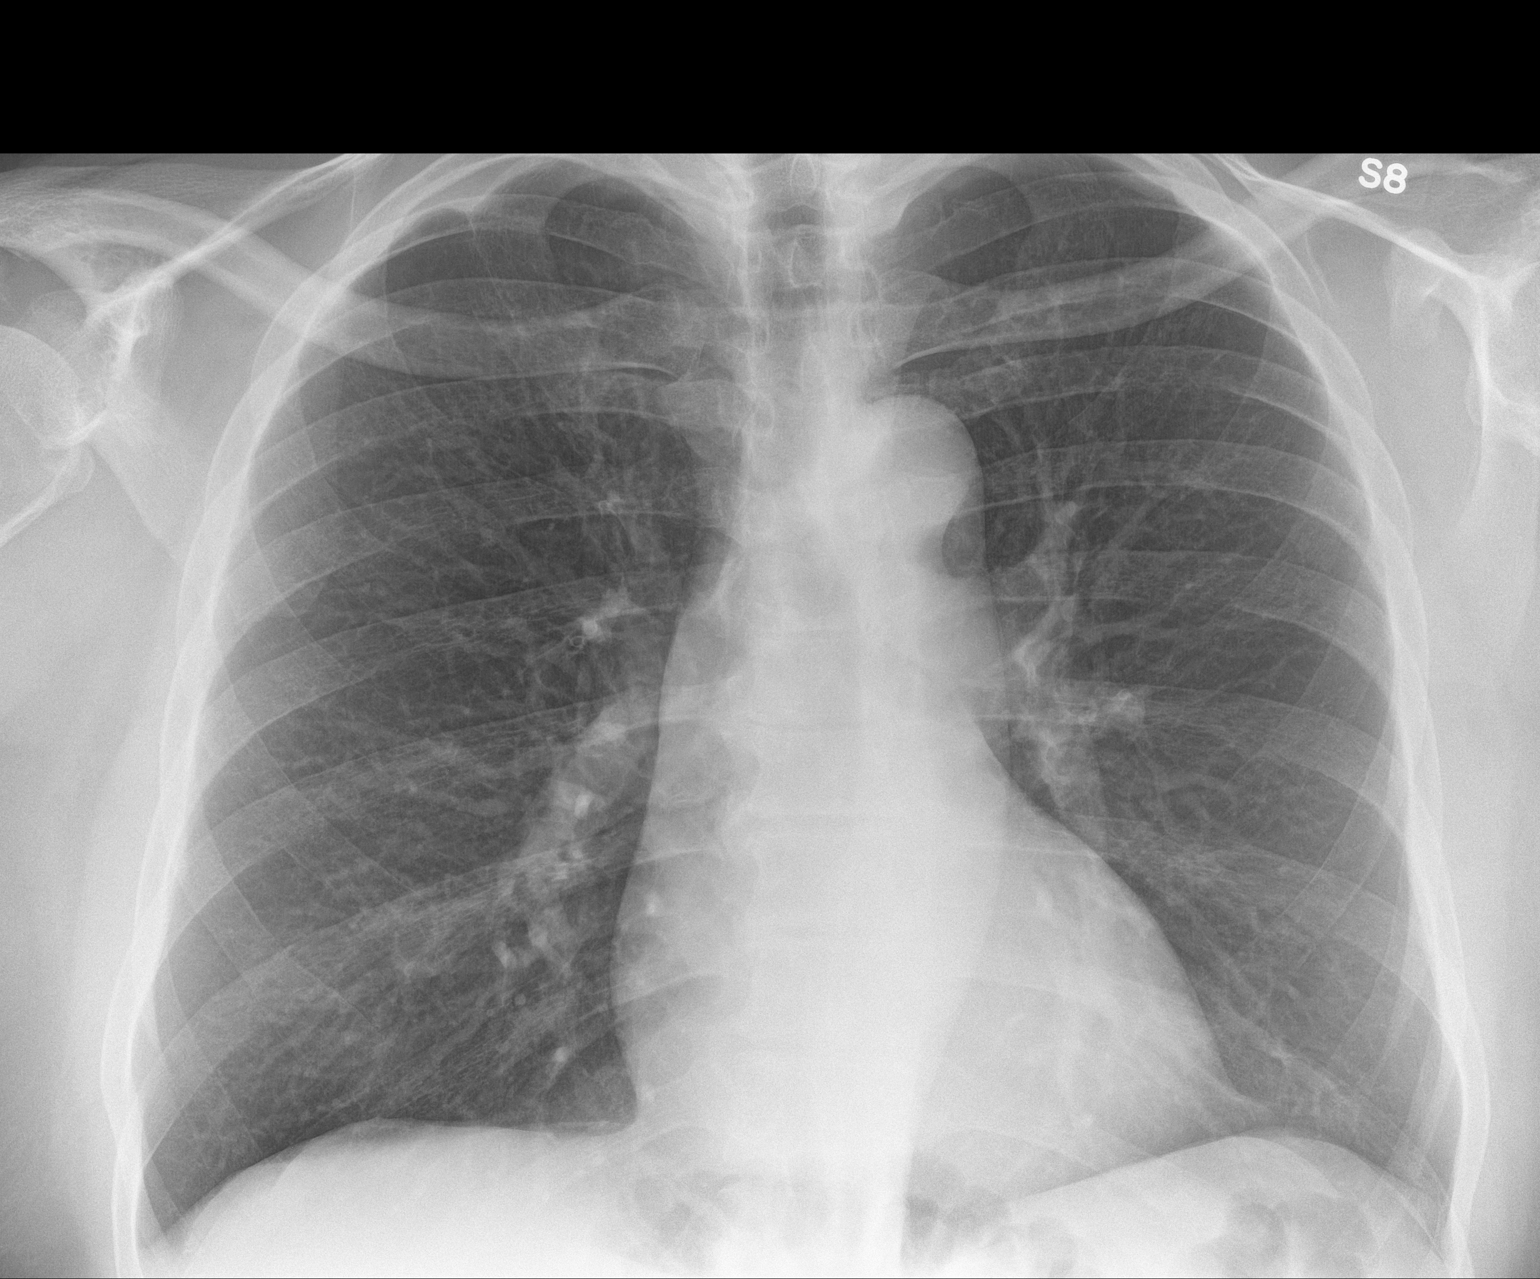

[chest lat]
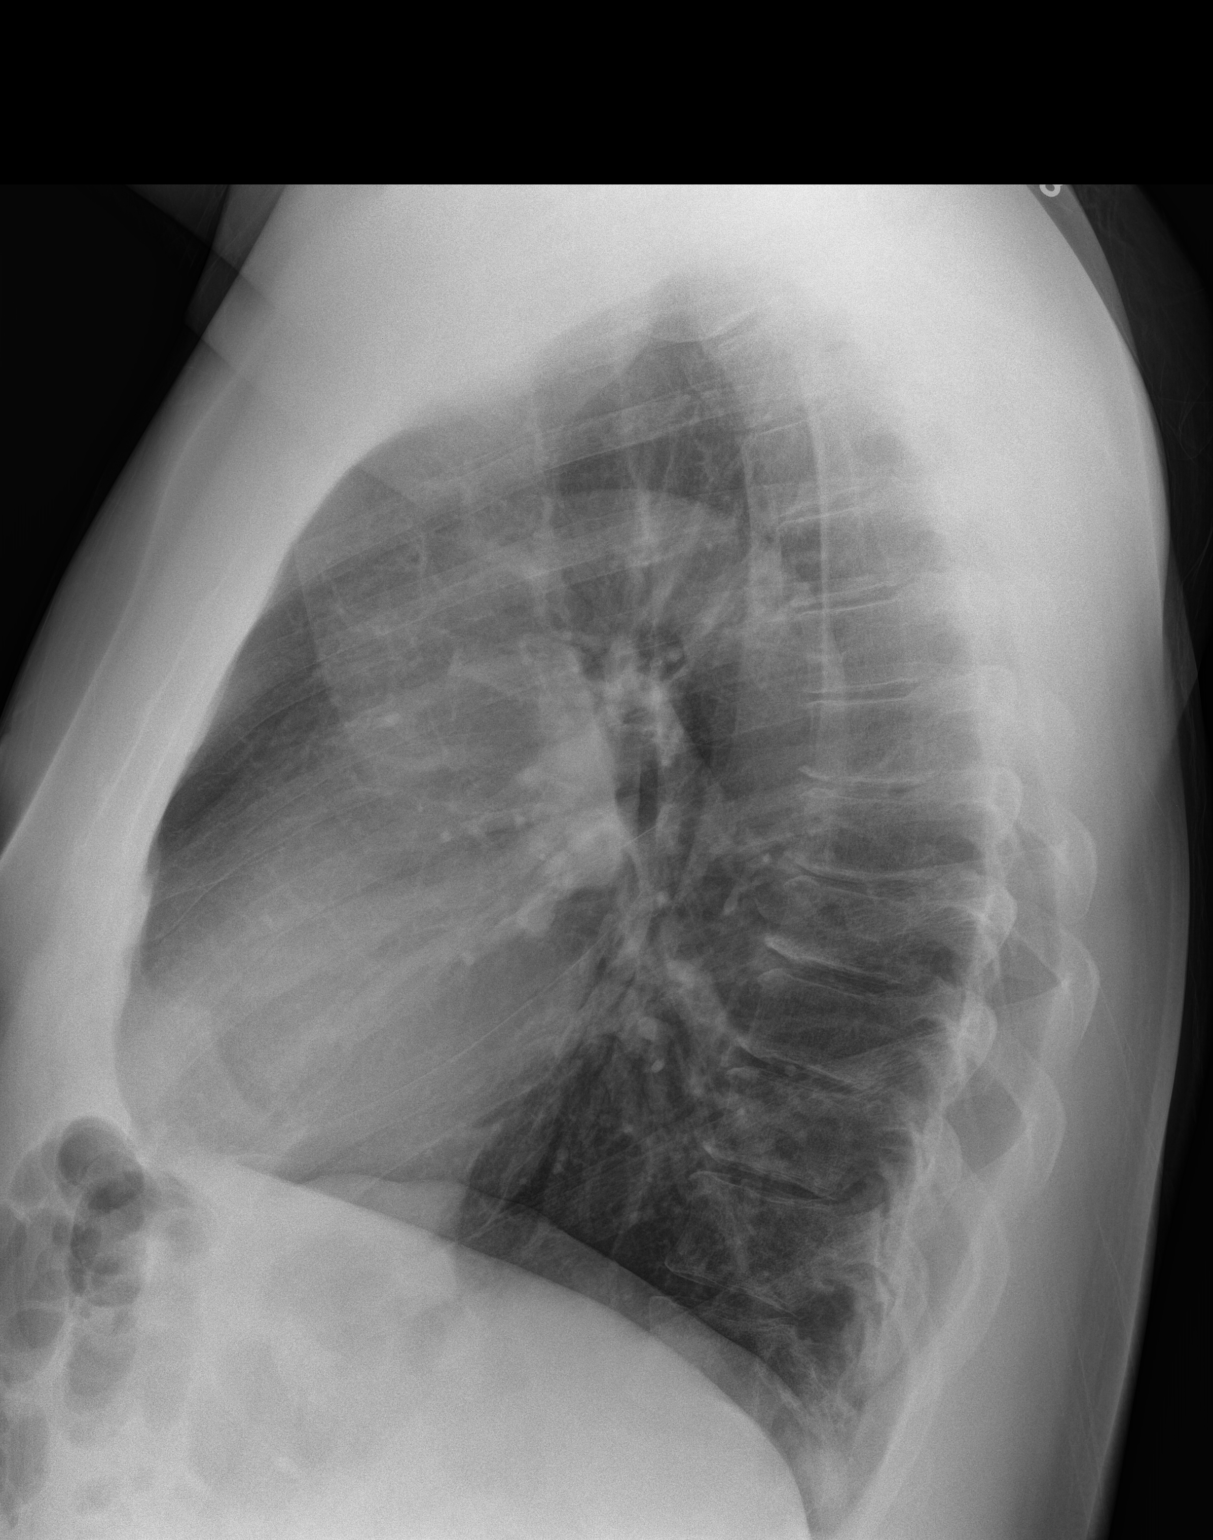

[2 of 2 positions shown; findings below may reference images not displayed]

FINDINGS: The lungs are clear. Heart size is normal. No pneumothorax or
pleural effusion. No focal bony abnormality.
IMPRESSION: Negative chest.

## 2017-02-24 ENCOUNTER — Encounter (HOSPITAL_COMMUNITY): Payer: Self-pay | Admitting: Adult Health

## 2017-02-24 ENCOUNTER — Other Ambulatory Visit: Payer: Self-pay

## 2017-02-24 ENCOUNTER — Emergency Department (HOSPITAL_COMMUNITY): Payer: Self-pay

## 2017-02-24 ENCOUNTER — Inpatient Hospital Stay (HOSPITAL_COMMUNITY)
Admission: EM | Admit: 2017-02-24 | Discharge: 2017-03-01 | DRG: 291 | Disposition: A | Discharge status: Home / Self Care | Payer: Self-pay | Attending: Internal Medicine | Admitting: Internal Medicine

## 2017-02-24 DIAGNOSIS — J810 Acute pulmonary edema: Secondary | ICD-10-CM | POA: Diagnosis present

## 2017-02-24 DIAGNOSIS — I130 Hypertensive heart and chronic kidney disease with heart failure and stage 1 through stage 4 chronic kidney disease, or unspecified chronic kidney disease: Principal | ICD-10-CM | POA: Diagnosis present

## 2017-02-24 DIAGNOSIS — R0902 Hypoxemia: Secondary | ICD-10-CM

## 2017-02-24 DIAGNOSIS — R7989 Other specified abnormal findings of blood chemistry: Secondary | ICD-10-CM

## 2017-02-24 DIAGNOSIS — I5031 Acute diastolic (congestive) heart failure: Secondary | ICD-10-CM | POA: Diagnosis present

## 2017-02-24 DIAGNOSIS — I110 Hypertensive heart disease with heart failure: Secondary | ICD-10-CM

## 2017-02-24 DIAGNOSIS — Z8249 Family history of ischemic heart disease and other diseases of the circulatory system: Secondary | ICD-10-CM

## 2017-02-24 DIAGNOSIS — F1410 Cocaine abuse, uncomplicated: Secondary | ICD-10-CM | POA: Diagnosis present

## 2017-02-24 DIAGNOSIS — R55 Syncope and collapse: Secondary | ICD-10-CM | POA: Diagnosis present

## 2017-02-24 DIAGNOSIS — R0603 Acute respiratory distress: Secondary | ICD-10-CM

## 2017-02-24 DIAGNOSIS — F17290 Nicotine dependence, other tobacco product, uncomplicated: Secondary | ICD-10-CM | POA: Diagnosis present

## 2017-02-24 DIAGNOSIS — I160 Hypertensive urgency: Secondary | ICD-10-CM | POA: Diagnosis present

## 2017-02-24 DIAGNOSIS — I10 Essential (primary) hypertension: Secondary | ICD-10-CM

## 2017-02-24 DIAGNOSIS — E876 Hypokalemia: Secondary | ICD-10-CM | POA: Diagnosis present

## 2017-02-24 DIAGNOSIS — F1210 Cannabis abuse, uncomplicated: Secondary | ICD-10-CM | POA: Diagnosis present

## 2017-02-24 DIAGNOSIS — N183 Chronic kidney disease, stage 3 (moderate): Secondary | ICD-10-CM | POA: Diagnosis present

## 2017-02-24 DIAGNOSIS — J9601 Acute respiratory failure with hypoxia: Secondary | ICD-10-CM | POA: Diagnosis present

## 2017-02-24 DIAGNOSIS — N179 Acute kidney failure, unspecified: Secondary | ICD-10-CM

## 2017-02-24 DIAGNOSIS — J9602 Acute respiratory failure with hypercapnia: Secondary | ICD-10-CM | POA: Diagnosis present

## 2017-02-24 DIAGNOSIS — N281 Cyst of kidney, acquired: Secondary | ICD-10-CM | POA: Diagnosis present

## 2017-02-24 HISTORY — DX: Hypertensive heart disease with heart failure: I11.0

## 2017-02-24 HISTORY — DX: Hypertensive heart disease with heart failure: I50.31

## 2017-02-24 LAB — CBC WITH DIFFERENTIAL/PLATELET
BASOS PCT: 0 %
Basophils Absolute: 0 10*3/uL (ref 0.0–0.1)
EOS ABS: 0.1 10*3/uL (ref 0.0–0.7)
EOS PCT: 1 %
HCT: 41.1 % (ref 39.0–52.0)
Hemoglobin: 13.4 g/dL (ref 13.0–17.0)
Lymphocytes Relative: 22 %
Lymphs Abs: 1.9 10*3/uL (ref 0.7–4.0)
MCH: 29.9 pg (ref 26.0–34.0)
MCHC: 32.6 g/dL (ref 30.0–36.0)
MCV: 91.7 fL (ref 78.0–100.0)
MONO ABS: 0.2 10*3/uL (ref 0.1–1.0)
MONOS PCT: 2 %
Neutro Abs: 6.7 10*3/uL (ref 1.7–7.7)
Neutrophils Relative %: 75 %
PLATELETS: 254 10*3/uL (ref 150–400)
RBC: 4.48 MIL/uL (ref 4.22–5.81)
RDW: 14.7 % (ref 11.5–15.5)
WBC: 8.9 10*3/uL (ref 4.0–10.5)

## 2017-02-24 LAB — URINALYSIS, ROUTINE W REFLEX MICROSCOPIC
Bacteria, UA: NONE SEEN
Bilirubin Urine: NEGATIVE
Glucose, UA: 150 mg/dL — AB
KETONES UR: NEGATIVE mg/dL
Leukocytes, UA: NEGATIVE
Nitrite: NEGATIVE
PROTEIN: 100 mg/dL — AB
Specific Gravity, Urine: 1.008 (ref 1.005–1.030)
pH: 7 (ref 5.0–8.0)

## 2017-02-24 LAB — I-STAT VENOUS BLOOD GAS, ED
Acid-base deficit: 2 mmol/L (ref 0.0–2.0)
BICARBONATE: 26.2 mmol/L (ref 20.0–28.0)
O2 Saturation: 91 %
PH VEN: 7.264 (ref 7.250–7.430)
TCO2: 28 mmol/L (ref 22–32)
pCO2, Ven: 57.9 mmHg (ref 44.0–60.0)
pO2, Ven: 72 mmHg — ABNORMAL HIGH (ref 32.0–45.0)

## 2017-02-24 LAB — COMPREHENSIVE METABOLIC PANEL
ALBUMIN: 3.6 g/dL (ref 3.5–5.0)
ALT: 45 U/L (ref 17–63)
ANION GAP: 13 (ref 5–15)
AST: 69 U/L — ABNORMAL HIGH (ref 15–41)
Alkaline Phosphatase: 116 U/L (ref 38–126)
BUN: 18 mg/dL (ref 6–20)
CHLORIDE: 103 mmol/L (ref 101–111)
CO2: 21 mmol/L — ABNORMAL LOW (ref 22–32)
Calcium: 8.2 mg/dL — ABNORMAL LOW (ref 8.9–10.3)
Creatinine, Ser: 1.95 mg/dL — ABNORMAL HIGH (ref 0.61–1.24)
GFR calc Af Amer: 44 mL/min — ABNORMAL LOW (ref >60)
GFR calc non Af Amer: 38 mL/min — ABNORMAL LOW (ref >60)
GLUCOSE: 219 mg/dL — AB (ref 65–99)
POTASSIUM: 3.4 mmol/L — AB (ref 3.5–5.1)
SODIUM: 137 mmol/L (ref 135–145)
TOTAL PROTEIN: 6.6 g/dL (ref 6.5–8.1)
Total Bilirubin: 0.5 mg/dL (ref 0.3–1.2)

## 2017-02-24 LAB — MRSA PCR SCREENING: MRSA by PCR: NEGATIVE

## 2017-02-24 LAB — RAPID URINE DRUG SCREEN, HOSP PERFORMED
Amphetamines: NOT DETECTED
BARBITURATES: NOT DETECTED
BENZODIAZEPINES: NOT DETECTED
COCAINE: POSITIVE — AB
Opiates: NOT DETECTED
Tetrahydrocannabinol: POSITIVE — AB

## 2017-02-24 LAB — TROPONIN I
Troponin I: 0.08 ng/mL (ref <0.03)
Troponin I: 0.28 ng/mL (ref <0.03)
Troponin I: 0.31 ng/mL (ref <0.03)

## 2017-02-24 LAB — BRAIN NATRIURETIC PEPTIDE: B Natriuretic Peptide: 204.1 pg/mL — ABNORMAL HIGH (ref 0.0–100.0)

## 2017-02-24 LAB — MAGNESIUM: Magnesium: 2 mg/dL (ref 1.7–2.4)

## 2017-02-24 LAB — PHOSPHORUS: Phosphorus: 2.7 mg/dL (ref 2.5–4.6)

## 2017-02-24 MED ORDER — NITROGLYCERIN IN D5W 200-5 MCG/ML-% IV SOLN
0.0000 ug/min | Freq: Once | INTRAVENOUS | Status: AC
  Administered 2017-02-24: 5 ug via INTRAVENOUS

## 2017-02-24 MED ORDER — SODIUM CHLORIDE 0.9% FLUSH: 3.0000 mL | INTRAVENOUS | Status: DC | PRN

## 2017-02-24 MED ORDER — FUROSEMIDE 10 MG/ML IJ SOLN
80.0000 mg | Freq: Two times a day (BID) | INTRAMUSCULAR | Status: DC
  Administered 2017-02-24 – 2017-02-25 (×2): 80 mg via INTRAVENOUS
  Filled 2017-02-24 (×2): qty 8

## 2017-02-24 MED ORDER — IPRATROPIUM-ALBUTEROL 0.5-2.5 (3) MG/3ML IN SOLN: 3.0000 mL | Freq: Four times a day (QID) | RESPIRATORY_TRACT | Status: DC | PRN

## 2017-02-24 MED ORDER — LISINOPRIL 20 MG PO TABS
20.0000 mg | ORAL_TABLET | Freq: Every day | ORAL | Status: DC
  Administered 2017-02-24: 20 mg via ORAL
  Filled 2017-02-24: qty 1

## 2017-02-24 MED ORDER — POTASSIUM CHLORIDE CRYS ER 20 MEQ PO TBCR
20.0000 meq | EXTENDED_RELEASE_TABLET | Freq: Two times a day (BID) | ORAL | Status: DC
  Administered 2017-02-24: 20 meq via ORAL
  Filled 2017-02-24: qty 1

## 2017-02-24 MED ORDER — ENOXAPARIN SODIUM 40 MG/0.4ML ~~LOC~~ SOLN
40.0000 mg | SUBCUTANEOUS | Status: DC
  Administered 2017-02-24 – 2017-02-28 (×5): 40 mg via SUBCUTANEOUS
  Filled 2017-02-24 (×5): qty 0.4

## 2017-02-24 MED ORDER — ASPIRIN EC 325 MG PO TBEC
325.0000 mg | DELAYED_RELEASE_TABLET | Freq: Every day | ORAL | Status: DC
Start: 2017-02-24 — End: 2017-03-01
  Administered 2017-02-24 – 2017-03-01 (×6): 325 mg via ORAL
  Filled 2017-02-24 (×6): qty 1

## 2017-02-24 MED ORDER — FUROSEMIDE 10 MG/ML IJ SOLN
80.0000 mg | Freq: Once | INTRAMUSCULAR | Status: AC
  Administered 2017-02-24: 80 mg via INTRAVENOUS
  Filled 2017-02-24: qty 8

## 2017-02-24 MED ORDER — HYDRALAZINE HCL 50 MG PO TABS
50.0000 mg | ORAL_TABLET | Freq: Three times a day (TID) | ORAL | Status: DC
  Administered 2017-02-24 – 2017-02-25 (×4): 50 mg via ORAL
  Filled 2017-02-24 (×4): qty 1

## 2017-02-24 MED ORDER — SODIUM CHLORIDE 0.9% FLUSH
3.0000 mL | Freq: Two times a day (BID) | INTRAVENOUS | Status: DC
  Administered 2017-02-24 – 2017-03-01 (×7): 3 mL via INTRAVENOUS

## 2017-02-24 MED ORDER — NITROGLYCERIN IN D5W 200-5 MCG/ML-% IV SOLN
INTRAVENOUS | Status: AC
  Administered 2017-02-24: 5 ug via INTRAVENOUS
  Filled 2017-02-24: qty 250

## 2017-02-24 MED ORDER — FUROSEMIDE 10 MG/ML IJ SOLN
80.0000 mg | Freq: Two times a day (BID) | INTRAMUSCULAR | Status: DC
  Filled 2017-02-24: qty 8

## 2017-02-24 MED ORDER — FUROSEMIDE 40 MG PO TABS
40.0000 mg | ORAL_TABLET | Freq: Once | ORAL | Status: DC
  Filled 2017-02-24: qty 1

## 2017-02-24 MED ORDER — FUROSEMIDE 10 MG/ML IJ SOLN: 40.0000 mg | Freq: Two times a day (BID) | INTRAMUSCULAR | Status: DC

## 2017-02-24 MED ORDER — SODIUM CHLORIDE 0.9 % IV SOLN: 250.0000 mL | INTRAVENOUS | Status: DC | PRN

## 2017-02-24 MED ORDER — NITROGLYCERIN IN D5W 200-5 MCG/ML-% IV SOLN
0.0000 ug/min | INTRAVENOUS | Status: DC
  Administered 2017-02-24: 160 ug/min via INTRAVENOUS
  Administered 2017-02-25: 165 ug/min via INTRAVENOUS
  Filled 2017-02-24 (×3): qty 250

## 2017-02-24 NOTE — ED Provider Notes (Signed)
Emergency Department Provider Note   I have reviewed the triage vital signs and the nursing notes.   HISTORY  Chief Complaint Respiratory Distress   HPI Alex Liu is a 51 y.o. male history of hypertension who presents the emergency department today secondary to respiratory distress.  Patient does not offer much history at this time he states he has been out of his blood pressure medication at least does not know if he is been taking it.  MS states that they were called out for an unresponsive person in the vehicle.  When they got there patient was tachypneic and unresponsive and hypertensive and not able to get a pulse ox.  He did have pinpoint pupils.  Get him in a truck and got a line and gave him Narcan but this is also after they had been dating for 10 or 15 minutes.  He slowly came around and was more alert at that time.  His pulse ox when they finally obtain one was 100%.  Continued on BiPAP until he got here at this time patient is able to answer yes/no questions but does still seem to be a bit sleepy.    Level V Caveat: Limited history secondary to acuity of condition.    Past Medical History:  Diagnosis Date  . Hypertension   . Hypertensive heart disease with acute diastolic congestive heart failure [facility](HCC)     Patient Active Problem List   Diagnosis Date Noted  . Acute pulmonary edema (HCC) 02/24/2017  . Hypertensive heart disease with acute diastolic congestive heart failure [facility](HCC)     Past Surgical History:  Procedure Laterality Date  . BRAIN SURGERY        Allergies Patient has no known allergies.  Family History  Problem Relation Age of Onset  . Hypertension Mother   . CAD Brother 3925  . Coronary artery disease Brother   . CAD Brother     Social History Social History   Tobacco Use  . Smoking status: Current Every Day Smoker    Packs/day: 1.00    Types: Cigars  Substance Use Topics  . Alcohol use: Yes  . Drug use: Yes    Types: Cocaine     Review of Systems  Level V Caveat: Limited history secondary to acuity of condition.  ____________________________________________   PHYSICAL EXAM:  VITAL SIGNS: ED Triage Vitals  Blood pressure (!) 164/112, pulse 87, resp. rate (!) 31, SpO2 96 %.   Constitutional: Alert and oriented. Ill appearing, sleepy, respiratory distress.  Eyes: Conjunctivae are normal. PERRL. EOMI. Head: Atraumatic. Nose: No congestion/rhinnorhea. Mouth/Throat: Mucous membranes are moist.  Oropharynx non-erythematous. Neck: No stridor.  No meningeal signs.   Cardiovascular: significantly hypertensive, tachycardic rate, regular rhythm. Good peripheral circulation. Grossly normal heart sounds.   Respiratory: distressed respiratory effort. tachypnea No retractions. Lungs with diffuse crackles and rhonchi. Gastrointestinal: Soft and nontender. No distention.  Musculoskeletal: No lower extremity tenderness nor edema. No gross deformities of extremities. Neurologic:  Normal speech and language. No gross focal neurologic deficits are appreciated.  Skin:  Skin is warm, dry and intact. No rash noted.   ____________________________________________   LABS (all labs ordered are listed, but only abnormal results are displayed)  Labs Reviewed  COMPREHENSIVE METABOLIC PANEL - Abnormal; Notable for the following components:      Result Value   Potassium 3.4 (*)    CO2 21 (*)    Glucose, Bld 219 (*)    Creatinine, Ser 1.95 (*)  Calcium 8.2 (*)    AST 69 (*)    GFR calc non Af Amer 38 (*)    GFR calc Af Amer 44 (*)    All other components within normal limits  BRAIN NATRIURETIC PEPTIDE - Abnormal; Notable for the following components:   B Natriuretic Peptide 204.1 (*)    All other components within normal limits  TROPONIN I - Abnormal; Notable for the following components:   Troponin I 0.08 (*)    All other components within normal limits  RAPID URINE DRUG SCREEN, HOSP PERFORMED - Abnormal; Notable for  the following components:   Cocaine POSITIVE (*)    Tetrahydrocannabinol POSITIVE (*)    All other components within normal limits  URINALYSIS, ROUTINE W REFLEX MICROSCOPIC - Abnormal; Notable for the following components:   Color, Urine STRAW (*)    Glucose, UA 150 (*)    Hgb urine dipstick SMALL (*)    Protein, ur 100 (*)    Squamous Epithelial / LPF 0-5 (*)    All other components within normal limits  TROPONIN I - Abnormal; Notable for the following components:   Troponin I 0.28 (*)    All other components within normal limits  TROPONIN I - Abnormal; Notable for the following components:   Troponin I 0.31 (*)    All other components within normal limits  I-STAT VENOUS BLOOD GAS, ED - Abnormal; Notable for the following components:   pO2, Ven 72.0 (*)    All other components within normal limits  MRSA PCR SCREENING  CBC WITH DIFFERENTIAL/PLATELET  MAGNESIUM  PHOSPHORUS  LIPID PANEL  HEMOGLOBIN A1C  HIV ANTIBODY (ROUTINE TESTING)  COMPREHENSIVE METABOLIC PANEL  CBC   ____________________________________________  EKG   EKG Interpretation  Date/Time:  Sunday February 24 2017 14:44:07 EST Ventricular Rate:  95 PR Interval:    QRS Duration: 102 QT Interval:  371 QTC Calculation: 467 R Axis:   58 Text Interpretation:  Sinus rhythm Consider left atrial enlargement LVH with secondary repolarization abnormality ST changes likely related to LVH and repol changes Confirmed by Marily Memos [phone]) on 02/24/2017 3:57:32 PM       ____________________________________________  RADIOLOGY  Dg Chest Portable 1 View  Result Date: 02/24/2017 CLINICAL DATA:  Respiratory distress EXAM: PORTABLE CHEST 1 VIEW COMPARISON:  10/29/2014 FINDINGS: Cardiac shadow is enlarged but stable. Bibasilar infiltrative changes are noted right greater than left. Mild vascular congestion is noted as well. Some atelectatic changes are noted in the left mid lung. No sizable effusion is seen.  IMPRESSION: New bibasilar infiltrates right greater than left. Mild vascular congestion as well. Electronically Signed   By: Alcide Clever M.D.   On: 02/24/2017 15:27    ____________________________________________   PROCEDURES  Procedure(s) performed:   Procedures  CRITICAL CARE Performed by: Marily Memos Total critical care time: 35 minutes Critical care time was exclusive of separately billable procedures and treating other patients. Critical care was necessary to treat or prevent imminent or life-threatening deterioration. Critical care was time spent personally by me on the following activities: development of treatment plan with patient and/or surrogate as well as nursing, discussions with consultants, evaluation of patient's response to treatment, examination of patient, obtaining history from patient or surrogate, ordering and performing treatments and interventions, ordering and review of laboratory studies, ordering and review of radiographic studies, pulse oximetry and re-evaluation of patient's condition. ____________________________________________   INITIAL IMPRESSION / ASSESSMENT AND PLAN / ED COURSE  Suspect patient did not actually have an  opioid overdose however was more likely flash pulmonary edema secondary to out-of-control hypertension.  I think that the reason he woke up after Narcan was more related to the fact he was receiving oxygen not that he got the Narcan.  When he is awake and alert denies any drug use.  We did have a UDS just to be sure.  He was significantly hypertensive started on nitro drip immediately upon arrival and BiPAP was continued.  The nitroglycerin drip was titrated multiple times by myself and the nurse in order to obtain a better blood pressure.  Lasix given as well.  Suspect acute pulmonary edema as a cause for his symptoms and will admit to the hospital for the same as this is a new diagnosis.   Pertinent labs & imaging results that were  available during my care of the patient were reviewed by me and considered in my medical decision making (see chart for details).  ____________________________________________  FINAL CLINICAL IMPRESSION(S) / ED DIAGNOSES  Final diagnoses:  Respiratory distress  Acute pulmonary edema (HCC)  Hypoxia  Acute respiratory failure with hypoxia (HCC)     MEDICATIONS GIVEN DURING THIS VISIT:  Medications  enoxaparin (LOVENOX) injection 40 mg (40 mg Subcutaneous Given 02/24/17 2238)  sodium chloride flush (NS) 0.9 % injection 3 mL (3 mLs Intravenous Given 02/24/17 2239)  sodium chloride flush (NS) 0.9 % injection 3 mL (not administered)  0.9 %  sodium chloride infusion (250 mLs Intravenous Rate/Dose Verify 02/24/17 1900)  aspirin EC tablet 325 mg (325 mg Oral Given 02/24/17 2017)  lisinopril (PRINIVIL,ZESTRIL) tablet 20 mg (20 mg Oral Given 02/24/17 2016)  nitroGLYCERIN 50 mg in dextrose 5 % 250 mL (0.2 mg/mL) infusion (165 mcg/min Intravenous Rate/Dose Change 02/24/17 2007)  hydrALAZINE (APRESOLINE) tablet 50 mg (50 mg Oral Given 02/24/17 2244)  potassium chloride SA (K-DUR,KLOR-CON) CR tablet 20 mEq (20 mEq Oral Given 02/24/17 2238)  ipratropium-albuterol (DUONEB) 0.5-2.5 (3) MG/3ML nebulizer solution 3 mL (not administered)  furosemide (LASIX) injection 80 mg (80 mg Intravenous Given 02/24/17 2237)  nitroGLYCERIN 50 mg in dextrose 5 % 250 mL (0.2 mg/mL) infusion (160 mcg/min Intravenous Transfusing/Transfer 02/24/17 1738)  furosemide (LASIX) injection 80 mg (80 mg Intravenous Given 02/24/17 1609)     NEW OUTPATIENT MEDICATIONS STARTED DURING THIS VISIT:  This SmartLink is deprecated. Use AVSMEDLIST instead to display the medication list for a patient.  Note:  This note was prepared with assistance of Dragon voice recognition software. Occasional wrong-word or sound-a-like substitutions may have occurred due to the inherent limitations of voice recognition software.  Mekaylah Klich, Barbara CowerJason,  MD 02/25/17 [phone]

## 2017-02-24 NOTE — Consult Note (Signed)
CARDIOLOGY CONSULT NOTE    Patient ID: Alex Liu; [id number]; [birth year]   Admit date: 02/24/2017 Date of Consult: 02/24/2017  Primary Care Provider: Default, Provider, MD Primary Cardiologist: New    Patient Profile:   Alex Liu is a 51 y.o. male with a hx of  Hypertension without medications, polysubstance abuse with cocaine and tobacco, also EtOH use.  Who is being seen today for the evaluation of pulmonary edema, hypertension, at the request of hospitalist service.  History of Present Illness:   Alex Liu was in his usual state of health when he began to have worsening trouble breathing, he was on his way home to get an inhaler and apparently lost consciousness.  Found unconscious lying in the street.  Saying he remembers is he was brought to ER and workup here.  The patient was given Narcan in the field by EMS, with concerns for opioid overdose.  On arrival to the emergency room he was found to be hypertensive with a blood pressure of 211 of 142, heart rate 97, he was tachypneic with respirations of 86, O2 sat 100% oxygen.  UDS was positive for cocaine, troponin 0 0.08, BNP 204.1.  Hemoglobin 13.4 hematocrit 41.1, sodium 137 potassium 3.4 glucose 219 creatinine 1.95.  EKG revealed sinus tachycardia with LVH, early repolarization abnormalities.  Rate of 95 bpm.  The patient was placed on BiPAP and given IV Lasix 80 mg in the emergency room.  He is began to diurese and remains on BiPAP with improvement in breathing status.  He has filled 1 full urinal and a third of another.  He denies chest pain, palpitations, dizziness, main complaint with worsening dyspnea and loss of consciousness.  Past Medical History:  Diagnosis Date  . Hypertension     Past Surgical History:  Procedure Laterality Date  . BRAIN SURGERY       Home Medications:  Prior to Admission medications   Medication Sig Start Date End Date Taking? Authorizing Provider  Misc. Devices MISC CPAP    Yes [provider]    Inpatient Medications: Scheduled Meds: . furosemide  80 mg Intravenous BID  . hydrALAZINE  50 mg Oral Q8H  . potassium chloride  20 mEq Oral BID   Continuous Infusions: . nitroGLYCERIN     PRN Meds:   Allergies:   No Known Allergies  Social History:   Social History   Socioeconomic History  . Marital status: Single    Spouse name: Not on file  . Number of children: Not on file  . Years of education: Not on file  . Highest education level: Not on file  Social Needs  . Financial resource strain: Not on file  . Food insecurity - worry: Not on file  . Food insecurity - inability: Not on file  . Transportation needs - medical: Not on file  . Transportation needs - non-medical: Not on file  Occupational History  . Not on file  Tobacco Use  . Smoking status: Current Every Day Smoker    Packs/day: 1.00    Types: Cigars  Substance and Sexual Activity  . Alcohol use: Yes  . Drug use: Yes    Types: Cocaine  . Sexual activity: Not on file  Other Topics Concern  . Not on file  Social History Narrative  . Not on file    Family History:    Family History  Problem Relation Age of Onset  . Hypertension Mother   . CAD Brother 6125  .  Coronary artery disease Brother   . CAD Brother      ROS:  Please see the history of present illness.  ROS  All other ROS reviewed and negative.     Physical Exam/Data:   Vitals:   02/24/17 1620 02/24/17 1625 02/24/17 1630 02/24/17 1640  BP: (!) 159/97 (!) 155/106 (!) 157/96 (!) 145/101  Pulse: 86 88 87 87  Resp: (!) 21 (!) 24 (!) 24 (!) 21  SpO2: 97% 96% 97% 97%    Intake/Output Summary (Last 24 hours) at 02/24/2017 1710 Last data filed at 02/24/2017 1612 Gross per 24 hour  Intake -  Output 550 ml  Net -550 ml   There were no vitals filed for this visit. There is no height or weight on file to calculate BMI.  General:  Well nourished, well developed, dyspneic, wearing BiPAP. HEENT:  normal Lymph: no adenopathy Neck: no JVD Endocrine:  No thryomegaly Vascular: No carotid bruits; FA pulses 2+ bilaterally without bruits  Cardiac:  normal S1, S2; RRR, tachycardic; no murmur  Lungs: Bilateral rales, no wheezing or coughing. Abd: soft, nontender, no hepatomegaly  Ext: no edema Musculoskeletal:  No deformities, BUE and BLE strength normal and equal Skin: warm and dry  Neuro:  CNs 2-12 intact, no focal abnormalities noted Psych:  Normal affect   EKG:  The EKG was personally reviewed and demonstrates: Sinus tachycardia with LVH, early repolarization abnormalities heart rate 95 bpm. Telemetry:  Telemetry was personally reviewed and demonstrates: Sinus tachycardia heart rate in the 90s.  Relevant CV Studies: None  Laboratory Data:  Chemistry Recent Labs  Lab 02/24/17 1452  NA 137  K 3.4*  CL 103  CO2 21*  GLUCOSE 219*  BUN 18  CREATININE 1.95*  CALCIUM 8.2*  GFRNONAA 38*  GFRAA 44*  ANIONGAP 13    Recent Labs  Lab 02/24/17 1452  PROT 6.6  ALBUMIN 3.6  AST 69*  ALT 45  ALKPHOS 116  BILITOT 0.5   Hematology Recent Labs  Lab 02/24/17 1452  WBC 8.9  RBC 4.48  HGB 13.4  HCT 41.1  MCV 91.7  MCH 29.9  MCHC 32.6  RDW 14.7  PLT 254   Cardiac Enzymes Recent Labs  Lab 02/24/17 1452  TROPONINI 0.08*   No results for input(s): TROPIPOC in the last 168 hours.  BNP Recent Labs  Lab 02/24/17 1452  BNP 204.1*    DDimer No results for input(s): DDIMER in the last 168 hours.  Radiology/Studies:  Dg Chest Portable 1 View  Result Date: 02/24/2017 CLINICAL DATA:  Respiratory distress EXAM: PORTABLE CHEST 1 VIEW COMPARISON:  10/29/2014 FINDINGS: Cardiac shadow is enlarged but stable. Bibasilar infiltrative changes are noted right greater than left. Mild vascular congestion is noted as well. Some atelectatic changes are noted in the left mid lung. No sizable effusion is seen. IMPRESSION: New bibasilar infiltrates right greater than left. Mild  vascular congestion as well. Electronically Signed   By: Alcide Clever M.D.   On: 02/24/2017 15:27    Assessment and Plan:   1.  Hypertensive urgency with hypertensive heart disease: Patient is been started on nitroglycerin drip via ER.  We will continue this, and begin p.o. hydralazine 50 mg every 8 hours to slowly get blood pressure under better control.  Eventually wean nitroglycerin and titrate hydralazine.  He is not a candidate for beta-blocker or ACE inhibitor at this time in the setting of cocaine and renal insufficiency.  Echocardiogram will be ordered for evaluation of LV  function.  He will eventually need to be placed on antihypertensive medications at home and likely diuretics.  More recommendations throughout hospital course depending upon patient's response to treatment and diagnostic testing results.  2.  Acute pulmonary edema: Patient continues on IV Lasix, we will place him on IV Lasix 80 mg twice daily with strict I&O and daily weights.  Ongoing kidney function will be essential.  Hope to eventually take him off BiPAP once breathing status is improved with diuresis.  3.  Risk factors for coronary artery disease: History of tobacco abuse, family history of premature heart disease (brother died at age 525 from MI), hypertension, will do risk stratification by checking fasting lipids and LFTs and hemoglobin A1c.  4.  Polysubstance abuse: Counseled on immediate cocaine sedation, as well as tobacco cessation.  Reducing EtOH intake is also recommended.   For questions or updates, please contact [facility] Please consult [email or web address] for contact info under Cardiology/STEMI.   Signed, Bettey MareKathryn M. Liborio NixonLawrence DNP, ANP, AACC  02/24/2017 5:10 PM  Patient seen and examined and history reviewed. Agree with above findings and plan. 51 yo BM presents to the ED after being found in the road. States he was walking home and the last thing he remembers he was having a lot of trouble breathing.  Notes 2 day history of increased SOB. No chest pain. No known medical history but hasn't seen a doctor. No history of HTN, cardiac disease, DM, or HLD. He is a smoker and has been trying to quit the past month by smoking cigars instead of cigarettes. He has a strong family history of heart disease. Admits to snorting cocaine 2 days ago.  On arrival patient in acute respiratory distress and placed on Bipap. Severely hypertensive.  Exam reveals patient alert on Bipap. No JVD visible. Lungs with bilateral rales and wheezing. CV tachy without gallop or murmur No edema. Pulses 2+  Ecg showed LVH with repolarization abnormality. I have personally reviewed and interpreted this study.  CXR show bilateral pulmonary edema  Labs remarkable for BNP 204. Troponin 0.08. Creatinine 1.95.   Impression: 1. Acute pulmonary edema most likely secondary to hypertensive heart disease. Doubt ACS. Exacerbated by recent cocaine use. Plan IV diuresis. BP control with IV Ntg and po  Hydralazine. Avoid beta blocker with recent cocaine use. Avoid ACEi/ARB in setting of renal insufficiency. Will check Echo in am 2. Renal failure class 3. Duration unknown 3. Cocaine use. Counsel on cessation 4. Uncontrolled HTN. I suspect on long term duration 5. Tobacco abuse  Peter SwazilandJordan, MDFACC 02/24/2017 6:56 PM

## 2017-02-24 NOTE — ED Triage Notes (Signed)
[facility] for [facility]. Pt found unresponsive in his vehicle, snoring respirations 30x/min, pinpoint pupils, absent lung sounds, capnography 34. Given 0.4mg  IV narcan and a duoneb. Pt then found to have rales, duoneb stopped and cpap initiated. Per EMS mentation began to improve with CPAP. Pt stated he was "trying to get homed to his machine" but was unable to tell EMS what machine it is. Pt unable to give med hx. Per EMS BP 260/160 manually, HR 130.

## 2017-02-24 NOTE — Progress Notes (Signed)
Patient transferred to 2c10 without complications. Vitals stable. Patient tolerated well. RN at beside. Becky, RT given report. RT will continue to monitor.

## 2017-02-24 NOTE — H&P (Signed)
History and Physical  Alex PiggsMonte L Liu RUE:[id number] DOB: 1965-06-28 DOA: 02/24/2017  Referring physician: ER Physician, Alex MemosJason Liu, M.D. PCP: None  Outpatient Specialists: None   Patient coming from: Transported to the ED from the side of the road by EMS.  Chief Complaint: "I passed out"  HPI: Patient is a 51 year old African American male with past medical history significant for Hypertension for which the patient had not been on medication. Patient does not have a PCP. Patient could not or would not give much history. Patient only tells me that he passed out, and was brought to the ER by EMS. Patient could not remember events leading to him passing out while driving home from visiting a friend. Specifically, patient denied chest pain or palpitation. Patient reports being short of breath. As per the ER Physician, patient was found unresponsive at the roadside and was deemed not to be breathing. CPR was started on patient by nearby people and EMS was called. Spontaneous breathing and circulation were achieved. Not clear if patient responded to Narcan as well. SBP was noted to be well over 200 mmHg. Patient was in significant respiratory distress on presentation. CXR revealed pulmonary edema, UDS was positive for both cocaine and tetrahydrocanabinol, troponin was 0.08, EKG revealed LVH, Cardiac BNP was 204 and ABG revealed PH of 7.26, PCO2 of 57 and PO2 of 72. Patient is currently on BiPAP. On further questioning, patient admitted sniffing cocaine at friend's birthday party last night, but denied regular use of cocaine. Patient has used cannabis for a long time as per patient. No headache, no neck pain, no chest pain, no fever or chills, no cough, no GI symptoms and no urinary symptoms. Patient's brother has had MI, and the other brother died from complications of cardiac procedure.  ED Course: Patient is currently on BiPAP. Patient remains SOB.  Pertinent labs: See above EKG: Independently  reviewed.  Imaging: independently reviewed.   Review of Systems: As in HPI. 12 points were reviewed.  Past Medical History:  Diagnosis Date  . Hypertension     Past Surgical History:  Procedure Laterality Date  . BRAIN SURGERY       reports that he has been smoking cigars.  He has been smoking about 1.00 pack per day. He does not have any smokeless tobacco history on file. He reports that he drinks alcohol. He reports that he does not use drugs.  No Known Allergies  Family history - Brother has had MI, and another brother died from complications of cardiac procedure (as per the patient)   Prior to Admission medications   Medication Sig Start Date End Date Taking? Authorizing Provider  Misc. Devices MISC CPAP   Yes [provider]    Physical Exam: Vitals:   02/24/17 1620 02/24/17 1625 02/24/17 1630 02/24/17 1640  BP: (!) 159/97 (!) 155/106 (!) 157/96 (!) 145/101  Pulse: 86 88 87 87  Resp: (!) 21 (!) 24 (!) 24 (!) 21  SpO2: 97% 96% 97% 97%    Constitutional:  . In respiratory distress. On BiPAP. AAO X 3. Eyes:  . No pallor. No jaundice.  ENMT:  . external ears, nose appear normal Neck:  . Neck is supple. JVD is difficult to assess. Prominent neck veins. Respiratory:  . Decreased entry, worse at the lung bases posteriorly  Cardiovascular:  . S1S2  Abdomen:  . Abdomen is soft and non tender. Organs are difficult to assess. Neurologic:  . Awake and alert. . Moves all limbs. Extremities -  No leg edema.  Wt Readings from Last 3 Encounters:  No data found for Wt    I have personally reviewed following labs and imaging studies  Labs on Admission:  CBC: Recent Labs  Lab 02/24/17 1452  WBC 8.9  NEUTROABS 6.7  HGB 13.4  HCT 41.1  MCV 91.7  PLT 254   Basic Metabolic Panel: Recent Labs  Lab 02/24/17 1452  NA 137  K 3.4*  CL 103  CO2 21*  GLUCOSE 219*  BUN 18  CREATININE 1.95*  CALCIUM 8.2*   Liver Function Tests: Recent Labs  Lab  02/24/17 1452  AST 69*  ALT 45  ALKPHOS 116  BILITOT 0.5  PROT 6.6  ALBUMIN 3.6   No results for input(s): LIPASE, AMYLASE in the last 168 hours. No results for input(s): AMMONIA in the last 168 hours. Coagulation Profile: No results for input(s): INR, PROTIME in the last 168 hours. Cardiac Enzymes: Recent Labs  Lab 02/24/17 1452  TROPONINI 0.08*   BNP (last 3 results) No results for input(s): PROBNP in the last 8760 hours. HbA1C: No results for input(s): HGBA1C in the last 72 hours. CBG: No results for input(s): GLUCAP in the last 168 hours. Lipid Profile: No results for input(s): CHOL, HDL, LDLCALC, TRIG, CHOLHDL, LDLDIRECT in the last 72 hours. Thyroid Function Tests: No results for input(s): TSH, T4TOTAL, FREET4, T3FREE, THYROIDAB in the last 72 hours. Anemia Panel: No results for input(s): VITAMINB12, FOLATE, FERRITIN, TIBC, IRON, RETICCTPCT in the last 72 hours. Urine analysis:    Component Value Date/Time   COLORURINE STRAW (A) 02/24/2017 1452   APPEARANCEUR CLEAR 02/24/2017 1452   LABSPEC 1.008 02/24/2017 1452   PHURINE 7.0 02/24/2017 1452   GLUCOSEU 150 (A) 02/24/2017 1452   HGBUR SMALL (A) 02/24/2017 1452   BILIRUBINUR NEGATIVE 02/24/2017 1452   KETONESUR NEGATIVE 02/24/2017 1452   PROTEINUR 100 (A) 02/24/2017 1452   UROBILINOGEN 1.0 02/28/2012 1037   NITRITE NEGATIVE 02/24/2017 1452   LEUKOCYTESUR NEGATIVE 02/24/2017 1452   Sepsis Labs: @LABRCNTIP (procalcitonin:4,lacticidven:4) )No results found for this or any previous visit (from the past 240 hour(s)).    Radiological Exams on Admission: Dg Chest Portable 1 View  Result Date: 02/24/2017 CLINICAL DATA:  Respiratory distress EXAM: PORTABLE CHEST 1 VIEW COMPARISON:  10/29/2014 FINDINGS: Cardiac shadow is enlarged but stable. Bibasilar infiltrative changes are noted right greater than left. Mild vascular congestion is noted as well. Some atelectatic changes are noted in the left mid lung. No sizable  effusion is seen. IMPRESSION: New bibasilar infiltrates right greater than left. Mild vascular congestion as well. Electronically Signed   By: Alex Liu M.D.   On: 02/24/2017 15:27    EKG: Independently reviewed.   Active Problems:   Acute pulmonary edema (HCC) Acute respiratory failure, combined hypoxic and hypercapnic. Elevated Troponin Hypertensive Urgency/emergency Syncope Hypokalemia Possible AKI Cannot rule out acute coronary syndrome Cocaine abuse Cannabis abuse    Assessment/Plan  Acute pulmonary edema (HCC) Acute respiratory failure, combined hypoxic and hypercapnic, possibly acute on chronic. Elevated Troponin Hypertensive Urgency/emergency Syncope Hypokalemia Possible AKI Cannot rule out acute coronary syndrome Cocaine abuse Cannabis abuse   Admit patient to step down Continue BiPAP Continue Nitro drip Cautious control of BP Diuretics Monitor renal function and replete electrolytes Counseled to quit illicit substance use Cardiology input Cycle cardiac enzymes Aspirin Nebs duoneb PRN  Further management will depend on hospital course.    DVT prophylaxis: Lovenox Code Status: Full Family Communication: None available Disposition Plan: Home eventually  Consults called: Cardiology   Admission status: Inpatient    Time spent: 60 minutes  Berton MountSylvester Ellisyn Icenhower, MD  [facility] Pager #: [phone] 7PM-7AM contact night coverage as above   02/24/2017, 4:51 PM

## 2017-02-24 NOTE — Progress Notes (Signed)
Patient is off BiPAP at this time. No apparent distress. Instructed patient to notify respiratory if he felt he needed to go back on the BiPAP. Respiratory will continue to monitor patient.

## 2017-02-24 NOTE — ED Notes (Signed)
Lab called to report critical lab:  Troponin 0.08.  Dr. Clayborne DanaMesner made aware.

## 2017-02-25 ENCOUNTER — Inpatient Hospital Stay (HOSPITAL_COMMUNITY): Payer: Self-pay

## 2017-02-25 DIAGNOSIS — R7989 Other specified abnormal findings of blood chemistry: Secondary | ICD-10-CM

## 2017-02-25 DIAGNOSIS — I351 Nonrheumatic aortic (valve) insufficiency: Secondary | ICD-10-CM

## 2017-02-25 LAB — ECHOCARDIOGRAM COMPLETE
Height: 72 in
WEIGHTICAEL: 3844.82 [oz_av]

## 2017-02-25 LAB — COMPREHENSIVE METABOLIC PANEL
ALT: 38 U/L (ref 17–63)
AST: 43 U/L — ABNORMAL HIGH (ref 15–41)
Albumin: 3.2 g/dL — ABNORMAL LOW (ref 3.5–5.0)
Alkaline Phosphatase: 87 U/L (ref 38–126)
Anion gap: 9 (ref 5–15)
BUN: 24 mg/dL — ABNORMAL HIGH (ref 6–20)
CO2: 29 mmol/L (ref 22–32)
Calcium: 8.2 mg/dL — ABNORMAL LOW (ref 8.9–10.3)
Chloride: 100 mmol/L — ABNORMAL LOW (ref 101–111)
Creatinine, Ser: 2.03 mg/dL — ABNORMAL HIGH (ref 0.61–1.24)
GFR calc Af Amer: 42 mL/min — ABNORMAL LOW (ref >60)
GFR calc non Af Amer: 37 mL/min — ABNORMAL LOW (ref >60)
Glucose, Bld: 112 mg/dL — ABNORMAL HIGH (ref 65–99)
Potassium: 3.2 mmol/L — ABNORMAL LOW (ref 3.5–5.1)
Sodium: 138 mmol/L (ref 135–145)
Total Bilirubin: 0.8 mg/dL (ref 0.3–1.2)
Total Protein: 5.9 g/dL — ABNORMAL LOW (ref 6.5–8.1)

## 2017-02-25 LAB — LIPID PANEL
CHOLESTEROL: 186 mg/dL (ref 0–200)
HDL: 40 mg/dL — ABNORMAL LOW (ref >40)
LDL Cholesterol: 131 mg/dL — ABNORMAL HIGH (ref 0–99)
Total CHOL/HDL Ratio: 4.7 RATIO
Triglycerides: 76 mg/dL (ref <150)
VLDL: 15 mg/dL (ref 0–40)

## 2017-02-25 LAB — CBC
HCT: 36.4 % — ABNORMAL LOW (ref 39.0–52.0)
Hemoglobin: 12 g/dL — ABNORMAL LOW (ref 13.0–17.0)
MCH: 29.7 pg (ref 26.0–34.0)
MCHC: 33 g/dL (ref 30.0–36.0)
MCV: 90.1 fL (ref 78.0–100.0)
Platelets: 229 10*3/uL (ref 150–400)
RBC: 4.04 MIL/uL — ABNORMAL LOW (ref 4.22–5.81)
RDW: 14.8 % (ref 11.5–15.5)
WBC: 10.7 10*3/uL — ABNORMAL HIGH (ref 4.0–10.5)

## 2017-02-25 LAB — HIV ANTIBODY (ROUTINE TESTING W REFLEX): HIV Screen 4th Generation wRfx: NONREACTIVE

## 2017-02-25 MED ORDER — FUROSEMIDE 10 MG/ML IJ SOLN
40.0000 mg | Freq: Two times a day (BID) | INTRAMUSCULAR | Status: DC
  Administered 2017-02-25: 40 mg via INTRAVENOUS
  Filled 2017-02-25: qty 4

## 2017-02-25 MED ORDER — POTASSIUM CHLORIDE CRYS ER 20 MEQ PO TBCR
40.0000 meq | EXTENDED_RELEASE_TABLET | Freq: Two times a day (BID) | ORAL | Status: DC
  Administered 2017-02-25 (×2): 40 meq via ORAL
  Filled 2017-02-25 (×2): qty 2

## 2017-02-25 MED ORDER — HYDRALAZINE HCL 50 MG PO TABS
100.0000 mg | ORAL_TABLET | Freq: Three times a day (TID) | ORAL | Status: DC
  Administered 2017-02-25 – 2017-03-01 (×12): 100 mg via ORAL
  Filled 2017-02-25 (×12): qty 2

## 2017-02-25 MED ORDER — CYCLOBENZAPRINE HCL 10 MG PO TABS
10.0000 mg | ORAL_TABLET | Freq: Three times a day (TID) | ORAL | Status: DC | PRN
  Administered 2017-02-25 (×2): 10 mg via ORAL
  Filled 2017-02-25 (×3): qty 1

## 2017-02-25 MED ORDER — ISOSORBIDE MONONITRATE ER 60 MG PO TB24
60.0000 mg | ORAL_TABLET | Freq: Every day | ORAL | Status: DC
  Administered 2017-02-25 – 2017-03-01 (×5): 60 mg via ORAL
  Filled 2017-02-25 (×5): qty 1

## 2017-02-25 MED ORDER — ACETAMINOPHEN 325 MG PO TABS
650.0000 mg | ORAL_TABLET | Freq: Four times a day (QID) | ORAL | Status: DC | PRN
  Administered 2017-02-25 – 2017-02-27 (×4): 650 mg via ORAL
  Filled 2017-02-25 (×3): qty 2

## 2017-02-25 NOTE — Progress Notes (Addendum)
Progress Note  Patient Name: Alex Liu Date of Encounter: 02/25/2017  Primary Cardiologist: Dr SwazilandJordan  Subjective   Pt denies CP or dyspnea  Inpatient Medications    Scheduled Meds: . aspirin EC  325 mg Oral Daily  . enoxaparin (LOVENOX) injection  40 mg Subcutaneous Q24H  . furosemide  80 mg Intravenous BID  . hydrALAZINE  50 mg Oral Q8H  . potassium chloride  40 mEq Oral BID  . sodium chloride flush  3 mL Intravenous Q12H   Continuous Infusions: . sodium chloride 250 mL (02/24/17 1900)  . nitroGLYCERIN 165 mcg/min (02/25/17 0130)   PRN Meds: sodium chloride, acetaminophen, ipratropium-albuterol, sodium chloride flush   Vital Signs    Vitals:   02/25/17 0500 02/25/17 0600 02/25/17 0630 02/25/17 0730  BP:  135/86 133/84 (!) 151/99  Pulse:  77 77 77  Resp:  (!) 21 (!) 21 20  Temp:    98.2 F (36.8 C)  TempSrc:    Oral  SpO2:  97% 95% 98%  Weight: 240 lb 4.8 oz (109 kg)     Height:        Intake/Output Summary (Last 24 hours) at 02/25/2017 0834 Last data filed at 02/25/2017 0600 Gross per 24 hour  Intake 1193.51 ml  Output 1675 ml  Net -481.49 ml   Filed Weights   02/24/17 1800 02/25/17 0500  Weight: 240 lb 11.9 oz (109.2 kg) 240 lb 4.8 oz (109 kg)    Telemetry    Sinus- Personally Reviewed  ECG    Sinus, LVH with repolarization abnormality - Personally Reviewed  Physical Exam   GEN: No acute distress.   Neck: No JVD Cardiac: RRR, no murmurs, rubs, or gallops.  Respiratory: Clear to auscultation bilaterally. GI: Soft, nontender, non-distended  MS: No edema; No deformity. Neuro:  Nonfocal  Psych: Normal affect   Labs    Chemistry Recent Labs  Lab 02/24/17 1452 02/25/17 0040  NA 137 138  K 3.4* 3.2*  CL 103 100*  CO2 21* 29  GLUCOSE 219* 112*  BUN 18 24*  CREATININE 1.95* 2.03*  CALCIUM 8.2* 8.2*  PROT 6.6 5.9*  ALBUMIN 3.6 3.2*  AST 69* 43*  ALT 45 38  ALKPHOS 116 87  BILITOT 0.5 0.8  GFRNONAA 38* 37*  GFRAA 44*  42*  ANIONGAP 13 9     Hematology Recent Labs  Lab 02/24/17 1452 02/25/17 0040  WBC 8.9 10.7*  RBC 4.48 4.04*  HGB 13.4 12.0*  HCT 41.1 36.4*  MCV 91.7 90.1  MCH 29.9 29.7  MCHC 32.6 33.0  RDW 14.7 14.8  PLT 254 229    Cardiac Enzymes Recent Labs  Lab 02/24/17 1452 02/24/17 1853 02/24/17 2015  TROPONINI 0.08* 0.28* 0.31*    BNP Recent Labs  Lab 02/24/17 1452  BNP 204.1*       Radiology    Dg Chest Portable 1 View  Result Date: 02/24/2017 CLINICAL DATA:  Respiratory distress EXAM: PORTABLE CHEST 1 VIEW COMPARISON:  10/29/2014 FINDINGS: Cardiac shadow is enlarged but stable. Bibasilar infiltrative changes are noted right greater than left. Mild vascular congestion is noted as well. Some atelectatic changes are noted in the left mid lung. No sizable effusion is seen. IMPRESSION: New bibasilar infiltrates right greater than left. Mild vascular congestion as well. Electronically Signed   By: Alcide CleverMark  Lukens M.D.   On: 02/24/2017 15:27    Patient Profile     51 y.o. male admitted with hypertensive urgency, respiratory distress and  cocaine use.  Assessment & Plan   1 Hypertensive urgency-BP improved; continue hydralazine; DC IV NTG and begin imdur. Follow BP and adjust meds as needed; no beta blocker for now given recent cocaine use; no ACEI for now given renal insuff. Pt instructed on consequences of uncontrolled hypertension.  2 Acute pulmonary edema-improved; change lasix to 40 mg BID and follow renal function; await echo results.  3 Stage 3 kidney disease-likely from uncontrolled hypertension; follow renal function closely.  4 elevated troponin-no chest pain; likely related to hypertensive urgency, pulmonary edema and renal insuff. Await echo; if normal, no further ischemia eval. If abnormal, will need nuclear study at some point. Would be at risk for contrast nephropathy if cath pursued.  5 cocaine abuse-pt counseled on avoiding.   6 tobacco abuse-pt counseled  on discontinuing.   7 Hypokalemia-supplement  For questions or updates, please contact [facility] Please consult [email or web address] for contact info under Cardiology/STEMI.      Signed, Olga MillersBrian Crenshaw, MD  02/25/2017, 8:34 AM

## 2017-02-25 NOTE — Progress Notes (Signed)
  Echocardiogram 2D Echocardiogram has been performed.  Aasiya Creasey L Androw 02/25/2017, 1:47 PM

## 2017-02-25 NOTE — Progress Notes (Signed)
[facility]. Pt is A&Ox4, with wife at bedside who has pt's belongings. Pt has been admitted to telemetry for 6E. Pt updated on room and unit change and educated on safety protocols. Wife and pt verbalize agreement. Will continue pt's plan of care.  Viviano SimasMorgan N Kadijah Shamoon, RN

## 2017-02-25 NOTE — Progress Notes (Signed)
PROGRESS NOTE    Alex Liu  ZOX:[id number] DOB: 1966-02-12 DOA: 02/24/2017 PCP: Default, Provider, MD   Brief Narrative: 51 year old African-American male with history significant for hypertension not on medication does not have a PCP presented with not feeling well and possible syncopal episode.  Urine toxicology positive for cocaine and cannabinoids.  Started on nitro glycerin drip for the management of hypertensive urgency.  IV Lasix for acute pulmonary edema.  Seen by cardiologist.  Assessment & Plan:   #Hypertensive urgency: Started on nitroglycerin drip with improvement in blood pressure.  Discontinue nitroglycerin drip today and started Imdur.  Continue Lasix, hydralazine.  No beta-blocker because of cocaine use.  No ACE inhibitor now because of renal insufficiency.  Patient was educated on low-salt diet.  If blood pressure continues to improve he may be able to transfer to medical floor.  #Acute pulmonary edema in the setting of hypertensive urgency: Change Lasix to 40 twice a day, monitor renal function.  Follow-up echocardiogram.  #Elevated troponin without chest pain in the setting of hypertensive urgency, renal insufficiency, pulmonary edema: Follow-up echocardiogram.  If echo is abnormal patient need further cardiac history.  Cardiology consult appreciated.  #Cocaine abuse: Patient was counseled.  #Elevated creatinine likely chronic kidney disease in the setting of uncontrolled hypertension: No recent baseline creatinine available.  UA showed WBC and red blood cells.  I will check A1c, renal ultrasound.  Recommended to avoid nephrotoxins.  He may benefit from outpatient follow-up with nephrologist.  I have discussed this with the patient.  DVT prophylaxis: Lovenox subcutaneous Code Status: Full code Family Communication: No family at bedside Disposition Plan: Stepdown now, may be able to transfer to floor if blood pressure improves    Consultants:    Cardiology  Procedures: None Antimicrobials: None  Subjective: Seen and examined at bedside.  Reported doing well.  Denies headache, dizziness, nausea vomiting chest pain shortness of breath.  Objective: Vitals:   02/25/17 0600 02/25/17 0630 02/25/17 0730 02/25/17 0800  BP: 135/86 133/84 (!) 151/99 (!) 150/100  Pulse: 77 77 77 79  Resp: (!) 21 (!) 21 20 20   Temp:   98.2 F (36.8 C)   TempSrc:   Oral   SpO2: 97% 95% 98% 97%  Weight:      Height:        Intake/Output Summary (Last 24 hours) at 02/25/2017 1151 Last data filed at 02/25/2017 0839 Gross per 24 hour  Intake 1193.51 ml  Output 1950 ml  Net -756.49 ml   Filed Weights   02/24/17 1800 02/25/17 0500  Weight: 109.2 kg (240 lb 11.9 oz) 109 kg (240 lb 4.8 oz)    Examination:  General exam: Appears calm and comfortable  Respiratory system: Clear to auscultation. Respiratory effort normal. No wheezing or crackle Cardiovascular system: S1 & S2 heard, RRR.  No pedal edema. Gastrointestinal system: Abdomen is nondistended, soft and nontender. Normal bowel sounds heard. Central nervous system: Alert and oriented. No focal neurological deficits. Extremities: Symmetric 5 x 5 power. Skin: No rashes, lesions or ulcers Psychiatry: Judgement and insight appear normal. Mood & affect appropriate.     Data Reviewed: I have personally reviewed following labs and imaging studies  CBC: Recent Labs  Lab 02/24/17 1452 02/25/17 0040  WBC 8.9 10.7*  NEUTROABS 6.7  --   HGB 13.4 12.0*  HCT 41.1 36.4*  MCV 91.7 90.1  PLT 254 229   Basic Metabolic Panel: Recent Labs  Lab 02/24/17 1452 02/24/17 1853 02/25/17 0040  NA 137  --  138  K 3.4*  --  3.2*  CL 103  --  100*  CO2 21*  --  29  GLUCOSE 219*  --  112*  BUN 18  --  24*  CREATININE 1.95*  --  2.03*  CALCIUM 8.2*  --  8.2*  MG  --  2.0  --   PHOS  --  2.7  --    GFR: Estimated Creatinine Clearance: 55.5 mL/min (A) (by C-G formula based on SCr of 2.03 mg/dL  (H)). Liver Function Tests: Recent Labs  Lab 02/24/17 1452 02/25/17 0040  AST 69* 43*  ALT 45 38  ALKPHOS 116 87  BILITOT 0.5 0.8  PROT 6.6 5.9*  ALBUMIN 3.6 3.2*   No results for input(s): LIPASE, AMYLASE in the last 168 hours. No results for input(s): AMMONIA in the last 168 hours. Coagulation Profile: No results for input(s): INR, PROTIME in the last 168 hours. Cardiac Enzymes: Recent Labs  Lab 02/24/17 1452 02/24/17 1853 02/24/17 2015  TROPONINI 0.08* 0.28* 0.31*   BNP (last 3 results) No results for input(s): PROBNP in the last 8760 hours. HbA1C: No results for input(s): HGBA1C in the last 72 hours. CBG: No results for input(s): GLUCAP in the last 168 hours. Lipid Profile: Recent Labs    02/25/17 0040  CHOL 186  HDL 40*  LDLCALC 131*  TRIG 76  CHOLHDL 4.7   Thyroid Function Tests: No results for input(s): TSH, T4TOTAL, FREET4, T3FREE, THYROIDAB in the last 72 hours. Anemia Panel: No results for input(s): VITAMINB12, FOLATE, FERRITIN, TIBC, IRON, RETICCTPCT in the last 72 hours. Sepsis Labs: No results for input(s): PROCALCITON, LATICACIDVEN in the last 168 hours.  Recent Results (from the past 240 hour(s))  MRSA PCR Screening     Status: None   Collection Time: 02/24/17  6:05 PM  Result Value Ref Range Status   MRSA by PCR NEGATIVE NEGATIVE Final    Comment:        The GeneXpert MRSA Assay (FDA approved for NASAL specimens only), is one component of a comprehensive MRSA colonization surveillance program. It is not intended to diagnose MRSA infection nor to guide or monitor treatment for MRSA infections.          Radiology Studies: Dg Chest Portable 1 View  Result Date: 02/24/2017 CLINICAL DATA:  Respiratory distress EXAM: PORTABLE CHEST 1 VIEW COMPARISON:  10/29/2014 FINDINGS: Cardiac shadow is enlarged but stable. Bibasilar infiltrative changes are noted right greater than left. Mild vascular congestion is noted as well. Some atelectatic  changes are noted in the left mid lung. No sizable effusion is seen. IMPRESSION: New bibasilar infiltrates right greater than left. Mild vascular congestion as well. Electronically Signed   By: Alcide CleverMark  Lukens M.D.   On: 02/24/2017 15:27        Scheduled Meds: . aspirin EC  325 mg Oral Daily  . enoxaparin (LOVENOX) injection  40 mg Subcutaneous Q24H  . furosemide  40 mg Intravenous BID  . hydrALAZINE  50 mg Oral Q8H  . isosorbide mononitrate  60 mg Oral Daily  . potassium chloride  40 mEq Oral BID  . sodium chloride flush  3 mL Intravenous Q12H   Continuous Infusions: . sodium chloride 250 mL (02/24/17 1900)     LOS: 1 day    Kishon Garriga Jaynie CollinsPrasad Yliana Gravois, MD [facility] Pager [phone]  If 7PM-7AM, please contact night-coverage [email or web address] Password [email or web address] 02/25/2017, 11:51 AM

## 2017-02-26 ENCOUNTER — Encounter (HOSPITAL_COMMUNITY): Payer: Self-pay

## 2017-02-26 LAB — BASIC METABOLIC PANEL
Anion gap: 11 (ref 5–15)
BUN: 24 mg/dL — ABNORMAL HIGH (ref 6–20)
CO2: 25 mmol/L (ref 22–32)
Calcium: 8.6 mg/dL — ABNORMAL LOW (ref 8.9–10.3)
Chloride: 103 mmol/L (ref 101–111)
Creatinine, Ser: 1.98 mg/dL — ABNORMAL HIGH (ref 0.61–1.24)
GFR calc Af Amer: 44 mL/min — ABNORMAL LOW (ref >60)
GFR calc non Af Amer: 38 mL/min — ABNORMAL LOW (ref >60)
Glucose, Bld: 97 mg/dL (ref 65–99)
Potassium: 3.8 mmol/L (ref 3.5–5.1)
Sodium: 139 mmol/L (ref 135–145)

## 2017-02-26 LAB — HEMOGLOBIN A1C
Hgb A1c MFr Bld: 5.7 % — ABNORMAL HIGH (ref 4.8–5.6)
MEAN PLASMA GLUCOSE: 117 mg/dL

## 2017-02-26 MED ORDER — FUROSEMIDE 40 MG PO TABS
40.0000 mg | ORAL_TABLET | Freq: Two times a day (BID) | ORAL | Status: DC
  Administered 2017-02-26 – 2017-02-28 (×5): 40 mg via ORAL
  Filled 2017-02-26 (×5): qty 1

## 2017-02-26 MED ORDER — AMLODIPINE BESYLATE 5 MG PO TABS
5.0000 mg | ORAL_TABLET | Freq: Once | ORAL | Status: AC
  Administered 2017-02-26: 5 mg via ORAL
  Filled 2017-02-26: qty 1

## 2017-02-26 MED ORDER — POTASSIUM CHLORIDE CRYS ER 20 MEQ PO TBCR
20.0000 meq | EXTENDED_RELEASE_TABLET | Freq: Two times a day (BID) | ORAL | Status: DC
  Administered 2017-02-26 – 2017-02-27 (×3): 20 meq via ORAL
  Filled 2017-02-26 (×3): qty 1

## 2017-02-26 MED ORDER — AMLODIPINE BESYLATE 10 MG PO TABS
10.0000 mg | ORAL_TABLET | Freq: Every day | ORAL | Status: DC
  Administered 2017-02-27 – 2017-03-01 (×3): 10 mg via ORAL
  Filled 2017-02-26 (×3): qty 1

## 2017-02-26 MED ORDER — AMLODIPINE BESYLATE 5 MG PO TABS
5.0000 mg | ORAL_TABLET | Freq: Every day | ORAL | Status: DC
  Administered 2017-02-26: 5 mg via ORAL
  Filled 2017-02-26: qty 1

## 2017-02-26 NOTE — Progress Notes (Addendum)
PROGRESS NOTE    Alex GOODIN  Liu:[id number] DOB: Nov 22, 1965 DOA: 02/24/2017 PCP: Default, Provider, MD     Brief Narrative:  Alex Liu is a 51 year old African-American male with history significant for hypertension not on medication does not have a PCP who presented with not feeling well and possible syncopal episode.  Urine toxicology positive for cocaine and cannabinoids.  Started on nitro glycerin drip for the management of hypertensive urgency.  IV Lasix for acute pulmonary edema.  Cardiology have been consulted and following.  Assessment & Plan:   Active Problems:   Acute pulmonary edema (HCC)   Hypertensive heart disease with acute diastolic congestive heart failure (HCC)   Elevated serum creatinine  Hypertensive urgency -Started on nitroglycerin drip with improvement in blood pressure.  Discontinue nitroglycerin drip and started Imdur.  Continue Lasix, hydralazine, started on norvasc.  No beta-blocker because of cocaine use.  No ACE inhibitor now because of renal insufficiency.   -Cardiology following   Acute pulmonary edema linked with hypertensive urgency and acute diastolic heart failure  -Lasix 40mg  PO twice a day  Elevated troponin without chest pain in the setting of hypertensive urgency, renal insufficiency, pulmonary edema -Echo EF 50-55%, grade 1 diastolic dysfunction  -No further ischemia evaluation per Cardiology   Cocaine abuse -Cessation counseling  Elevated creatinine likely chronic kidney disease in the setting of uncontrolled hypertension -No recent baseline creatinine available.  UA showed WBC and red blood cells. Renal US showed left renal simple cyst.  No other abnormality seen. Avoid nephrotoxins.      DVT prophylaxis: lovenox Code Status: full Family Communication: no family at bedside Disposition Plan: pending improvement   Consultants:   Cardiology  Procedures:   None  Antimicrobials:  Anti-infectives (From admission,  onward)   None       Subjective: No complaints this morning.  Denies any chest pain or shortness of breath, no nausea or vomiting or abdominal pain.  Tolerated breakfast without issues.  Objective: Vitals:   02/25/17 1915 02/26/17 0500 02/26/17 1413 02/26/17 1436  BP: (!) 180/106 (!) 195/118 (!) 160/109   Pulse:  84  93  Resp: (!) 30 20    Temp: 98.9 F (37.2 C) 99.2 F (37.3 C)  98 F (36.7 C)  TempSrc: Oral Oral  Oral  SpO2: 96% 98%  97%  Weight:  105.1 kg (231 lb 11.2 oz)    Height:        Intake/Output Summary (Last 24 hours) at 02/26/2017 1510 Last data filed at 02/26/2017 1300 Gross per 24 hour  Intake 840 ml  Output -  Net 840 ml   Filed Weights   02/24/17 1800 02/25/17 0500 02/26/17 0500  Weight: 109.2 kg (240 lb 11.9 oz) 109 kg (240 lb 4.8 oz) 105.1 kg (231 lb 11.2 oz)    Examination:  General exam: Appears calm and comfortable  Respiratory system: Clear to auscultation. Respiratory effort normal. Cardiovascular system: S1 & S2 heard, RRR. No JVD, murmurs, rubs, gallops or clicks. No pedal edema. Gastrointestinal system: Abdomen is nondistended, soft and nontender. No organomegaly or masses felt. Normal bowel sounds heard. Central nervous system: Alert and oriented. No focal neurological deficits. Extremities: Symmetric 5 x 5 power. Skin: No rashes, lesions or ulcers Psychiatry: Judgement and insight appear normal. Mood & affect appropriate.   Data Reviewed: I have personally reviewed following labs and imaging studies  CBC: Recent Labs  Lab 02/24/17 1452 02/25/17 0040  WBC 8.9 10.7*  NEUTROABS 6.7  --  HGB 13.4 12.0*  HCT 41.1 36.4*  MCV 91.7 90.1  PLT 254 229   Basic Metabolic Panel: Recent Labs  Lab 02/24/17 1452 02/24/17 1853 02/25/17 0040 02/26/17 0543  NA 137  --  138 139  K 3.4*  --  3.2* 3.8  CL 103  --  100* 103  CO2 21*  --  29 25  GLUCOSE 219*  --  112* 97  BUN 18  --  24* 24*  CREATININE 1.95*  --  2.03* 1.98*  CALCIUM  8.2*  --  8.2* 8.6*  MG  --  2.0  --   --   PHOS  --  2.7  --   --    GFR: Estimated Creatinine Clearance: 55.9 mL/min (A) (by C-G formula based on SCr of 1.98 mg/dL (H)). Liver Function Tests: Recent Labs  Lab 02/24/17 1452 02/25/17 0040  AST 69* 43*  ALT 45 38  ALKPHOS 116 87  BILITOT 0.5 0.8  PROT 6.6 5.9*  ALBUMIN 3.6 3.2*   No results for input(s): LIPASE, AMYLASE in the last 168 hours. No results for input(s): AMMONIA in the last 168 hours. Coagulation Profile: No results for input(s): INR, PROTIME in the last 168 hours. Cardiac Enzymes: Recent Labs  Lab 02/24/17 1452 02/24/17 1853 02/24/17 2015  TROPONINI 0.08* 0.28* 0.31*   BNP (last 3 results) No results for input(s): PROBNP in the last 8760 hours. HbA1C: Recent Labs    02/25/17 0040  HGBA1C 5.7*   CBG: No results for input(s): GLUCAP in the last 168 hours. Lipid Profile: Recent Labs    02/25/17 0040  CHOL 186  HDL 40*  LDLCALC 131*  TRIG 76  CHOLHDL 4.7   Thyroid Function Tests: No results for input(s): TSH, T4TOTAL, FREET4, T3FREE, THYROIDAB in the last 72 hours. Anemia Panel: No results for input(s): VITAMINB12, FOLATE, FERRITIN, TIBC, IRON, RETICCTPCT in the last 72 hours. Sepsis Labs: No results for input(s): PROCALCITON, LATICACIDVEN in the last 168 hours.  Recent Results (from the past 240 hour(s))  MRSA PCR Screening     Status: None   Collection Time: 02/24/17  6:05 PM  Result Value Ref Range Status   MRSA by PCR NEGATIVE NEGATIVE Final    Comment:        The GeneXpert MRSA Assay (FDA approved for NASAL specimens only), is one component of a comprehensive MRSA colonization surveillance program. It is not intended to diagnose MRSA infection nor to guide or monitor treatment for MRSA infections.        Radiology Studies: [place] Renal  Result Date: 02/25/2017 CLINICAL DATA:  Elevated cm creatinine. EXAM: RENAL / URINARY TRACT ULTRASOUND COMPLETE COMPARISON:  Ultrasound of  February 28, 2012. FINDINGS: Right Kidney: Length: 10.7 cm. Echogenicity within normal limits. No mass or hydronephrosis visualized. Left Kidney: Length: 9.5 cm. 2.4 cm simple cyst seen in midpole. Echogenicity within normal limits. No mass or hydronephrosis visualized. Bladder: Appears normal for degree of bladder distention. IMPRESSION: Left renal simple cyst.  No other abnormality seen. Electronically Signed   By: Lupita RaiderJames  Green Jr, M.D.   On: 02/25/2017 14:06      Scheduled Meds: . amLODipine  5 mg Oral Daily  . aspirin EC  325 mg Oral Daily  . enoxaparin (LOVENOX) injection  40 mg Subcutaneous Q24H  . furosemide  40 mg Oral BID  . hydrALAZINE  100 mg Oral Q8H  . isosorbide mononitrate  60 mg Oral Daily  . potassium chloride  20 mEq Oral  BID  . sodium chloride flush  3 mL Intravenous Q12H   Continuous Infusions: . sodium chloride 250 mL (02/24/17 1900)     LOS: 2 days    Time spent: 40 minutes   Noralee StainJennifer Shawntee Mainwaring, DO [facility] [email or web address] Password [email or web address] 02/26/2017, 3:10 PM

## 2017-02-26 NOTE — Progress Notes (Signed)
Progress Note  Patient Name: Alex Liu Date of Encounter: 02/26/2017  Primary Cardiologist: Dr SwazilandJordan  Subjective   No CP or dyspnea  Inpatient Medications    Scheduled Meds: . aspirin EC  325 mg Oral Daily  . enoxaparin (LOVENOX) injection  40 mg Subcutaneous Q24H  . furosemide  40 mg Intravenous BID  . hydrALAZINE  100 mg Oral Q8H  . isosorbide mononitrate  60 mg Oral Daily  . potassium chloride  40 mEq Oral BID  . sodium chloride flush  3 mL Intravenous Q12H   Continuous Infusions: . sodium chloride 250 mL (02/24/17 1900)   PRN Meds: sodium chloride, acetaminophen, cyclobenzaprine, ipratropium-albuterol, sodium chloride flush   Vital Signs    Vitals:   02/25/17 1300 02/25/17 1400 02/25/17 1915 02/26/17 0500  BP: (!) 158/106 140/89 (!) 180/106 (!) 195/118  Pulse: 85 86  84  Resp: (!) 31 (!) 30 (!) 30 20  Temp:   98.9 F (37.2 C) 99.2 F (37.3 C)  TempSrc:   Oral Oral  SpO2: 95% 94% 96% 98%  Weight:    231 lb 11.2 oz (105.1 kg)  Height:        Intake/Output Summary (Last 24 hours) at 02/26/2017 0812 Last data filed at 02/26/2017 0507 Gross per 24 hour  Intake 360 ml  Output 475 ml  Net -115 ml   Filed Weights   02/24/17 1800 02/25/17 0500 02/26/17 0500  Weight: 240 lb 11.9 oz (109.2 kg) 240 lb 4.8 oz (109 kg) 231 lb 11.2 oz (105.1 kg)    Physical Exam   GEN: No acute distress.   Neck: No JVD Cardiac: RRR, no murmurs, rubs, or gallops.  Respiratory: Clear to auscultation bilaterally. GI: Soft, nontender, non-distended  MS: No edema Neuro:  Nonfocal  Psych: Normal affect   Labs    Chemistry Recent Labs  Lab 02/24/17 1452 02/25/17 0040 02/26/17 0543  NA 137 138 139  K 3.4* 3.2* 3.8  CL 103 100* 103  CO2 21* 29 25  GLUCOSE 219* 112* 97  BUN 18 24* 24*  CREATININE 1.95* 2.03* 1.98*  CALCIUM 8.2* 8.2* 8.6*  PROT 6.6 5.9*  --   ALBUMIN 3.6 3.2*  --   AST 69* 43*  --   ALT 45 38  --   ALKPHOS 116 87  --   BILITOT 0.5 0.8  --     GFRNONAA 38* 37* 38*  GFRAA 44* 42* 44*  ANIONGAP 13 9 11      Hematology Recent Labs  Lab 02/24/17 1452 02/25/17 0040  WBC 8.9 10.7*  RBC 4.48 4.04*  HGB 13.4 12.0*  HCT 41.1 36.4*  MCV 91.7 90.1  MCH 29.9 29.7  MCHC 32.6 33.0  RDW 14.7 14.8  PLT 254 229    Cardiac Enzymes Recent Labs  Lab 02/24/17 1452 02/24/17 1853 02/24/17 2015  TROPONINI 0.08* 0.28* 0.31*    BNP Recent Labs  Lab 02/24/17 1452  BNP 204.1*       Radiology    [place] Renal  Result Date: 02/25/2017 CLINICAL DATA:  Elevated cm creatinine. EXAM: RENAL / URINARY TRACT ULTRASOUND COMPLETE COMPARISON:  Ultrasound of February 28, 2012. FINDINGS: Right Kidney: Length: 10.7 cm. Echogenicity within normal limits. No mass or hydronephrosis visualized. Left Kidney: Length: 9.5 cm. 2.4 cm simple cyst seen in midpole. Echogenicity within normal limits. No mass or hydronephrosis visualized. Bladder: Appears normal for degree of bladder distention. IMPRESSION: Left renal simple cyst.  No other abnormality seen. Electronically  Signed   By: Lupita RaiderJames  Green Jr, M.D.   On: 02/25/2017 14:06   Dg Chest Portable 1 View  Result Date: 02/24/2017 CLINICAL DATA:  Respiratory distress EXAM: PORTABLE CHEST 1 VIEW COMPARISON:  10/29/2014 FINDINGS: Cardiac shadow is enlarged but stable. Bibasilar infiltrative changes are noted right greater than left. Mild vascular congestion is noted as well. Some atelectatic changes are noted in the left mid lung. No sizable effusion is seen. IMPRESSION: New bibasilar infiltrates right greater than left. Mild vascular congestion as well. Electronically Signed   By: Alcide CleverMark  Lukens M.D.   On: 02/24/2017 15:27    Patient Profile     51 y.o. male admitted with hypertensive urgency, respiratory distress and cocaine use.  Assessment & Plan   1 Hypertensive urgency-BP remains elevated; add amlodipine 5 mg daily; continue hydralazine/nitrates. Follow BP and adjust meds as needed; no beta blocker for  now given recent cocaine use; no ACEI for now given renal insuff. Pt instructed on consequences of uncontrolled hypertension.  2 Acute pulmonary edema-resolved; change lasix to 40 mg BID; follow renal function.   3 Stage 3 kidney disease-likely from uncontrolled hypertension; follow renal function closely with use of lasix.  4 elevated troponin-no chest pain; likely related to hypertensive urgency, pulmonary edema and renal insuff. Echo shows normal LV function. No further ischemia eval.  5 cocaine abuse-pt previously counseled on avoiding.   6 tobacco abuse-pt previously counseled on discontinuing.   7 Hypokalemia-improved; change kdur to 20 meq BID and follow.  For questions or updates, please contact [facility] Please consult [email or web address] for contact info under Cardiology/STEMI.      Signed, Olga MillersBrian Hayes Rehfeldt, MD  02/26/2017, 8:12 AM

## 2017-02-26 NOTE — Care Management Note (Signed)
Case Management Note  Patient Details  Name: Alex Liu MRN: [id number] Date of Birth: 02-27-1966  Subjective/Objective:                 Spoke to patient at the bedside. He confirms he does not have a PCP or insurance coverage. Demonstarted use of [employer] app for mediaction discounts. F/U apt made at Patient care center (sickle cell clinic) Dec 26 at 09:30am, entered on AVS. [facility]   Action/Plan:  Continue to follow DC med list and assess for MATCH needs.  Expected Discharge Date:  02/26/17               Expected Discharge Plan:  Home/Self Care  In-House Referral:     Discharge planning Services  CM Consult, Medication Assistance  Post Acute Care Choice:    Choice offered to:     DME Arranged:    DME Agency:     HH Arranged:    HH Agency:     Status of Service:  In process, will continue to follow  If discussed at Long Length of Stay Meetings, dates discussed:    Additional Comments:  Lawerance SabalDebbie Vergil Burby, RN 02/26/2017, 2:39 PM

## 2017-02-27 ENCOUNTER — Encounter (HOSPITAL_COMMUNITY): Payer: Self-pay

## 2017-02-27 DIAGNOSIS — I160 Hypertensive urgency: Secondary | ICD-10-CM

## 2017-02-27 LAB — BASIC METABOLIC PANEL
Anion gap: 10 (ref 5–15)
BUN: 20 mg/dL (ref 6–20)
CO2: 26 mmol/L (ref 22–32)
Calcium: 8.6 mg/dL — ABNORMAL LOW (ref 8.9–10.3)
Chloride: 104 mmol/L (ref 101–111)
Creatinine, Ser: 1.83 mg/dL — ABNORMAL HIGH (ref 0.61–1.24)
GFR calc Af Amer: 48 mL/min — ABNORMAL LOW (ref >60)
GFR calc non Af Amer: 41 mL/min — ABNORMAL LOW (ref >60)
Glucose, Bld: 103 mg/dL — ABNORMAL HIGH (ref 65–99)
Potassium: 3.4 mmol/L — ABNORMAL LOW (ref 3.5–5.1)
Sodium: 140 mmol/L (ref 135–145)

## 2017-02-27 MED ORDER — HYDRALAZINE HCL 20 MG/ML IJ SOLN
10.0000 mg | Freq: Once | INTRAMUSCULAR | Status: DC
Start: 2017-02-27 — End: 2017-02-27

## 2017-02-27 MED ORDER — HYDRALAZINE HCL 20 MG/ML IJ SOLN
5.0000 mg | Freq: Once | INTRAMUSCULAR | Status: AC
  Administered 2017-02-27: 5 mg via INTRAVENOUS
  Filled 2017-02-27: qty 1

## 2017-02-27 MED ORDER — POTASSIUM CHLORIDE CRYS ER 20 MEQ PO TBCR
40.0000 meq | EXTENDED_RELEASE_TABLET | Freq: Two times a day (BID) | ORAL | Status: DC
  Administered 2017-02-27 – 2017-02-28 (×2): 40 meq via ORAL
  Filled 2017-02-27 (×2): qty 2

## 2017-02-27 MED ORDER — LABETALOL HCL 100 MG PO TABS
100.0000 mg | ORAL_TABLET | Freq: Two times a day (BID) | ORAL | Status: DC
  Administered 2017-02-27 – 2017-02-28 (×3): 100 mg via ORAL
  Filled 2017-02-27 (×3): qty 1

## 2017-02-27 NOTE — Progress Notes (Signed)
Progress Note  Patient Name: Alex Liu Date of Encounter: 02/27/2017  Primary Cardiologist: Dr SwazilandJordan  Subjective   Pt denies CP or dyspnea  Inpatient Medications    Scheduled Meds: . amLODipine  10 mg Oral Daily  . aspirin EC  325 mg Oral Daily  . enoxaparin (LOVENOX) injection  40 mg Subcutaneous Q24H  . furosemide  40 mg Oral BID  . hydrALAZINE  100 mg Oral Q8H  . isosorbide mononitrate  60 mg Oral Daily  . potassium chloride  20 mEq Oral BID  . sodium chloride flush  3 mL Intravenous Q12H   Continuous Infusions: . sodium chloride 250 mL (02/24/17 1900)   PRN Meds: sodium chloride, acetaminophen, cyclobenzaprine, ipratropium-albuterol, sodium chloride flush    Telemetry personally reviewed; sinus with brief PAT  Vital Signs    Vitals:   02/27/17 0033 02/27/17 0448 02/27/17 0527 02/27/17 0811  BP: (!) 179/114 (!) 162/139 (!) 189/123 (!) 203/125  Pulse: 90 88  93  Resp: 20 19    Temp: 98.8 F (37.1 C) 98.4 F (36.9 C)  98.8 F (37.1 C)  TempSrc: Oral Oral  Oral  SpO2: 98% 99%  100%  Weight:  233 lb 9.6 oz (106 kg)    Height:        Intake/Output Summary (Last 24 hours) at 02/27/2017 0901 Last data filed at 02/26/2017 2200 Gross per 24 hour  Intake 360 ml  Output -  Net 360 ml   Filed Weights   02/25/17 0500 02/26/17 0500 02/27/17 0448  Weight: 240 lb 4.8 oz (109 kg) 231 lb 11.2 oz (105.1 kg) 233 lb 9.6 oz (106 kg)    Physical Exam   GEN: WD/WN No acute distress.   Neck: No JVD, supple Cardiac: RRR Respiratory: Clear to auscultation bilaterally; no wheeze GI: Soft, nontender, non-distended, no masses  MS: No edema Neuro:  Grossly intact   Labs    Chemistry Recent Labs  Lab 02/24/17 1452 02/25/17 0040 02/26/17 0543 02/27/17 0313  NA 137 138 139 140  K 3.4* 3.2* 3.8 3.4*  CL 103 100* 103 104  CO2 21* 29 25 26   GLUCOSE 219* 112* 97 103*  BUN 18 24* 24* 20  CREATININE 1.95* 2.03* 1.98* 1.83*  CALCIUM 8.2* 8.2* 8.6* 8.6*    PROT 6.6 5.9*  --   --   ALBUMIN 3.6 3.2*  --   --   AST 69* 43*  --   --   ALT 45 38  --   --   ALKPHOS 116 87  --   --   BILITOT 0.5 0.8  --   --   GFRNONAA 38* 37* 38* 41*  GFRAA 44* 42* 44* 48*  ANIONGAP 13 9 11 10      Hematology Recent Labs  Lab 02/24/17 1452 02/25/17 0040  WBC 8.9 10.7*  RBC 4.48 4.04*  HGB 13.4 12.0*  HCT 41.1 36.4*  MCV 91.7 90.1  MCH 29.9 29.7  MCHC 32.6 33.0  RDW 14.7 14.8  PLT 254 229    Cardiac Enzymes Recent Labs  Lab 02/24/17 1452 02/24/17 1853 02/24/17 2015  TROPONINI 0.08* 0.28* 0.31*    BNP Recent Labs  Lab 02/24/17 1452  BNP 204.1*       Radiology    [place] Renal  Result Date: 02/25/2017 CLINICAL DATA:  Elevated cm creatinine. EXAM: RENAL / URINARY TRACT ULTRASOUND COMPLETE COMPARISON:  Ultrasound of February 28, 2012. FINDINGS: Right Kidney: Length: 10.7 cm. Echogenicity within normal  limits. No mass or hydronephrosis visualized. Left Kidney: Length: 9.5 cm. 2.4 cm simple cyst seen in midpole. Echogenicity within normal limits. No mass or hydronephrosis visualized. Bladder: Appears normal for degree of bladder distention. IMPRESSION: Left renal simple cyst.  No other abnormality seen. Electronically Signed   By: Lupita RaiderJames  Green Jr, M.D.   On: 02/25/2017 14:06    Patient Profile     51 y.o. male admitted with hypertensive urgency, respiratory distress and cocaine use.  Assessment & Plan   1 Hypertensive urgency-BP remains elevated; continue hydralazine/nitrates and lasix. Increase amlodipine to 10 mg daily; add labetalol 100 mg BID. Follow BP and adjust meds as needed; Will hold on ACEI or ARB for now given renal insuff.   2 Acute pulmonary edema- continue lasix to 40 mg BID; follow renal function.   3 Stage 3 kidney disease-likely from uncontrolled hypertension.  4 elevated troponin-no chest pain; elevated troponin felt likely related to hypertensive urgency, pulmonary edema and renal insuff. Echo shows normal LV  function. No further ischemia eval.  5 cocaine abuse-needs to avoid  6 tobacco abuse-needs to avoid  7 Hypokalemia-change kdur to 40 meq BID and follow.  For questions or updates, please contact [facility] Please consult [email or web address] for contact info under Cardiology/STEMI.      Signed, Olga MillersBrian Crenshaw, MD  02/27/2017, 9:01 AM

## 2017-02-27 NOTE — Progress Notes (Signed)
PROGRESS NOTE    Alex PiggsMonte L Scheidegger  ZOX:[id number] DOB: 13-Mar-1965 DOA: 02/24/2017 PCP: Default, Provider, MD     Brief Narrative:  Alex Liu is a [age] year old African-American male with history significant for hypertension not on medication does not have a PCP who presented with not feeling well and possible syncopal episode.  Urine toxicology positive for cocaine and cannabinoids.  Started on nitro glycerin drip for the management of hypertensive urgency. IV Lasix for acute pulmonary edema, switched to PO. Cardiology have been consulted and following.  Assessment & Plan:   Active Problems:   Acute pulmonary edema (HCC)   Hypertensive heart disease with acute diastolic congestive heart failure (HCC)   Elevated serum creatinine  Hypertensive urgency -Started on nitroglycerin drip with improvement in blood pressure.  Discontinue nitroglycerin drip and started Imdur.  Continue Lasix, hydralazine, started on norvasc as well as labetalol.  No ACE inhibitor now because of renal insufficiency.   -Cardiology following   Acute pulmonary edema linked with hypertensive urgency and acute diastolic heart failure  -Lasix 40mg  PO twice a day  Elevated troponin without chest pain in the setting of hypertensive urgency, renal insufficiency, pulmonary edema -Echo EF 50-55%, grade 1 diastolic dysfunction  -No further ischemia evaluation per Cardiology   Cocaine abuse -Cessation counseling  Elevated creatinine likely chronic kidney disease in the setting of uncontrolled hypertension -No recent baseline creatinine available.  UA showed WBC and red blood cells. Renal US showed left renal simple cyst.  No other abnormality seen. Avoid nephrotoxins.     DVT prophylaxis: lovenox Code Status: full Family Communication: no family at bedside Disposition Plan: pending improvement in BP    Consultants:   Cardiology  Procedures:   None  Antimicrobials:  Anti-infectives (From admission,  onward)   None       Subjective: No complaints this morning.  Denies any chest pain or shortness of breath   Objective: Vitals:   02/27/17 0448 02/27/17 0527 02/27/17 0811 02/27/17 1223  BP: (!) 162/139 (!) 189/123 (!) 203/125 (!) 185/112  Pulse: 88  93 89  Resp: 19     Temp: 98.4 F (36.9 C)  98.8 F (37.1 C) 98 F (36.7 C)  TempSrc: Oral  Oral Oral  SpO2: 99%  100%   Weight: 106 kg (233 lb 9.6 oz)     Height:        Intake/Output Summary (Last 24 hours) at 02/27/2017 1342 Last data filed at 02/27/2017 1226 Gross per 24 hour  Intake 720 ml  Output -  Net 720 ml   Filed Weights   02/25/17 0500 02/26/17 0500 02/27/17 0448  Weight: 109 kg (240 lb 4.8 oz) 105.1 kg (231 lb 11.2 oz) 106 kg (233 lb 9.6 oz)    Examination:  General exam: Appears calm and comfortable  Respiratory system: Clear to auscultation. Respiratory effort normal. Cardiovascular system: S1 & S2 heard, RRR. No JVD, murmurs, rubs, gallops or clicks. No pedal edema. Gastrointestinal system: Abdomen is nondistended, soft and nontender. No organomegaly or masses felt. Normal bowel sounds heard. Central nervous system: Alert and oriented. No focal neurological deficits. Extremities: Symmetric 5 x 5 power. Skin: No rashes, lesions or ulcers Psychiatry: Judgement and insight appear normal. Mood & affect appropriate.   Data Reviewed: I have personally reviewed following labs and imaging studies  CBC: Recent Labs  Lab 02/24/17 1452 02/25/17 0040  WBC 8.9 10.7*  NEUTROABS 6.7  --   HGB 13.4 12.0*  HCT 41.1 36.4*  MCV  91.7 90.1  PLT 254 229   Basic Metabolic Panel: Recent Labs  Lab 02/24/17 1452 02/24/17 1853 02/25/17 0040 02/26/17 0543 02/27/17 0313  NA 137  --  138 139 140  K 3.4*  --  3.2* 3.8 3.4*  CL 103  --  100* 103 104  CO2 21*  --  29 25 26   GLUCOSE 219*  --  112* 97 103*  BUN 18  --  24* 24* 20  CREATININE 1.95*  --  2.03* 1.98* 1.83*  CALCIUM 8.2*  --  8.2* 8.6* 8.6*  MG  --   2.0  --   --   --   PHOS  --  2.7  --   --   --    GFR: Estimated Creatinine Clearance: 60.8 mL/min (A) (by C-G formula based on SCr of 1.83 mg/dL (H)). Liver Function Tests: Recent Labs  Lab 02/24/17 1452 02/25/17 0040  AST 69* 43*  ALT 45 38  ALKPHOS 116 87  BILITOT 0.5 0.8  PROT 6.6 5.9*  ALBUMIN 3.6 3.2*   No results for input(s): LIPASE, AMYLASE in the last 168 hours. No results for input(s): AMMONIA in the last 168 hours. Coagulation Profile: No results for input(s): INR, PROTIME in the last 168 hours. Cardiac Enzymes: Recent Labs  Lab 02/24/17 1452 02/24/17 1853 02/24/17 2015  TROPONINI 0.08* 0.28* 0.31*   BNP (last 3 results) No results for input(s): PROBNP in the last 8760 hours. HbA1C: Recent Labs    02/25/17 0040  HGBA1C 5.7*   CBG: No results for input(s): GLUCAP in the last 168 hours. Lipid Profile: Recent Labs    02/25/17 0040  CHOL 186  HDL 40*  LDLCALC 131*  TRIG 76  CHOLHDL 4.7   Thyroid Function Tests: No results for input(s): TSH, T4TOTAL, FREET4, T3FREE, THYROIDAB in the last 72 hours. Anemia Panel: No results for input(s): VITAMINB12, FOLATE, FERRITIN, TIBC, IRON, RETICCTPCT in the last 72 hours. Sepsis Labs: No results for input(s): PROCALCITON, LATICACIDVEN in the last 168 hours.  Recent Results (from the past 240 hour(s))  MRSA PCR Screening     Status: None   Collection Time: 02/24/17  6:05 PM  Result Value Ref Range Status   MRSA by PCR NEGATIVE NEGATIVE Final    Comment:        The GeneXpert MRSA Assay (FDA approved for NASAL specimens only), is one component of a comprehensive MRSA colonization surveillance program. It is not intended to diagnose MRSA infection nor to guide or monitor treatment for MRSA infections.        Radiology Studies: US Renal  Result Date: 02/25/2017 CLINICAL DATA:  Elevated cm creatinine. EXAM: RENAL / URINARY TRACT ULTRASOUND COMPLETE COMPARISON:  Ultrasound of February 28, 2012.  FINDINGS: Right Kidney: Length: 10.7 cm. Echogenicity within normal limits. No mass or hydronephrosis visualized. Left Kidney: Length: 9.5 cm. 2.4 cm simple cyst seen in midpole. Echogenicity within normal limits. No mass or hydronephrosis visualized. Bladder: Appears normal for degree of bladder distention. IMPRESSION: Left renal simple cyst.  No other abnormality seen. Electronically Signed   By: Lupita Raider, M.D.   On: 02/25/2017 14:06      Scheduled Meds: . amLODipine  10 mg Oral Daily  . aspirin EC  325 mg Oral Daily  . enoxaparin (LOVENOX) injection  40 mg Subcutaneous Q24H  . furosemide  40 mg Oral BID  . hydrALAZINE  100 mg Oral Q8H  . isosorbide mononitrate  60 mg Oral Daily  .  labetalol  100 mg Oral BID  . potassium chloride  40 mEq Oral BID  . sodium chloride flush  3 mL Intravenous Q12H   Continuous Infusions: . sodium chloride 250 mL (02/24/17 1900)     LOS: 3 days    Time spent: 20 minutes   Noralee StainJennifer Jathniel Smeltzer, DO [facility] [email or web address] Password [email or web address] 02/27/2017, 1:42 PM

## 2017-02-28 ENCOUNTER — Encounter (HOSPITAL_COMMUNITY): Payer: Self-pay

## 2017-02-28 DIAGNOSIS — I10 Essential (primary) hypertension: Secondary | ICD-10-CM

## 2017-02-28 LAB — BASIC METABOLIC PANEL
Anion gap: 8 (ref 5–15)
BUN: 19 mg/dL (ref 6–20)
CO2: 26 mmol/L (ref 22–32)
Calcium: 8.9 mg/dL (ref 8.9–10.3)
Chloride: 105 mmol/L (ref 101–111)
Creatinine, Ser: 1.79 mg/dL — ABNORMAL HIGH (ref 0.61–1.24)
GFR calc Af Amer: 49 mL/min — ABNORMAL LOW (ref >60)
GFR calc non Af Amer: 43 mL/min — ABNORMAL LOW (ref >60)
Glucose, Bld: 109 mg/dL — ABNORMAL HIGH (ref 65–99)
Potassium: 3.8 mmol/L (ref 3.5–5.1)
Sodium: 139 mmol/L (ref 135–145)

## 2017-02-28 MED ORDER — FUROSEMIDE 40 MG PO TABS
40.0000 mg | ORAL_TABLET | Freq: Every day | ORAL | Status: DC
  Administered 2017-03-01: 40 mg via ORAL
  Filled 2017-02-28: qty 1

## 2017-02-28 MED ORDER — POTASSIUM CHLORIDE CRYS ER 20 MEQ PO TBCR
20.0000 meq | EXTENDED_RELEASE_TABLET | Freq: Every day | ORAL | Status: DC
  Administered 2017-03-01: 20 meq via ORAL
  Filled 2017-02-28: qty 1

## 2017-02-28 MED ORDER — SPIRONOLACTONE 25 MG PO TABS
25.0000 mg | ORAL_TABLET | Freq: Every day | ORAL | Status: DC
  Administered 2017-02-28 – 2017-03-01 (×2): 25 mg via ORAL
  Filled 2017-02-28 (×2): qty 1

## 2017-02-28 MED ORDER — LABETALOL HCL 200 MG PO TABS
200.0000 mg | ORAL_TABLET | Freq: Two times a day (BID) | ORAL | Status: DC
  Administered 2017-02-28 – 2017-03-01 (×2): 200 mg via ORAL
  Filled 2017-02-28 (×2): qty 1

## 2017-02-28 NOTE — Progress Notes (Signed)
Spoke to GlendaleJohnna, Charity fundraiserN. Patient to be NPO after midnight for renal artery duplex to be performed 12/21 AM.  Farrel DemarkJill Eunice, RDMS, RVT

## 2017-02-28 NOTE — Progress Notes (Signed)
Progress Note  Patient Name: Alex Liu Date of Encounter: 02/28/2017  Primary Cardiologist: Dr SwazilandJordan  Subjective   No chest pain or dyspnea  Inpatient Medications    Scheduled Meds: . amLODipine  10 mg Oral Daily  . aspirin EC  325 mg Oral Daily  . enoxaparin (LOVENOX) injection  40 mg Subcutaneous Q24H  . furosemide  40 mg Oral BID  . hydrALAZINE  100 mg Oral Q8H  . isosorbide mononitrate  60 mg Oral Daily  . labetalol  100 mg Oral BID  . potassium chloride  40 mEq Oral BID  . sodium chloride flush  3 mL Intravenous Q12H   Continuous Infusions: . sodium chloride 250 mL (02/24/17 1900)   PRN Meds: sodium chloride, acetaminophen, cyclobenzaprine, ipratropium-albuterol, sodium chloride flush    Telemetry personally reviewed; sinus  Vital Signs    Vitals:   02/27/17 1708 02/27/17 1950 02/28/17 0417 02/28/17 0825  BP: (!) 165/102 (!) 183/110 (!) 180/107 (!) 185/117  Pulse: 89 90 86   Resp:  19 (!) 24   Temp: 99.1 F (37.3 C) 99.2 F (37.3 C) 98.7 F (37.1 C)   TempSrc: Oral Oral Oral   SpO2: 98% 95% 98%   Weight:   233 lb 3.2 oz (105.8 kg)   Height:        Intake/Output Summary (Last 24 hours) at 02/28/2017 0911 Last data filed at 02/28/2017 0900 Gross per 24 hour  Intake 1640 ml  Output -  Net 1640 ml   Filed Weights   02/26/17 0500 02/27/17 0448 02/28/17 0417  Weight: 231 lb 11.2 oz (105.1 kg) 233 lb 9.6 oz (106 kg) 233 lb 3.2 oz (105.8 kg)    Physical Exam   GEN: WD/WN NAD Neck: supple Cardiac: RRR, no murmur Respiratory: CTA GI: Soft, NT/ND MS: No edema Neuro:  No focal findings   Labs    Chemistry Recent Labs  Lab 02/24/17 1452 02/25/17 0040 02/26/17 0543 02/27/17 0313 02/28/17 0644  NA 137 138 139 140 139  K 3.4* 3.2* 3.8 3.4* 3.8  CL 103 100* 103 104 105  CO2 21* 29 25 26 26   GLUCOSE 219* 112* 97 103* 109*  BUN 18 24* 24* 20 19  CREATININE 1.95* 2.03* 1.98* 1.83* 1.79*  CALCIUM 8.2* 8.2* 8.6* 8.6* 8.9  PROT 6.6  5.9*  --   --   --   ALBUMIN 3.6 3.2*  --   --   --   AST 69* 43*  --   --   --   ALT 45 38  --   --   --   ALKPHOS 116 87  --   --   --   BILITOT 0.5 0.8  --   --   --   GFRNONAA 38* 37* 38* 41* 43*  GFRAA 44* 42* 44* 48* 49*  ANIONGAP 13 9 11 10 8      Hematology Recent Labs  Lab 02/24/17 1452 02/25/17 0040  WBC 8.9 10.7*  RBC 4.48 4.04*  HGB 13.4 12.0*  HCT 41.1 36.4*  MCV 91.7 90.1  MCH 29.9 29.7  MCHC 32.6 33.0  RDW 14.7 14.8  PLT 254 229    Cardiac Enzymes Recent Labs  Lab 02/24/17 1452 02/24/17 1853 02/24/17 2015  TROPONINI 0.08* 0.28* 0.31*    BNP Recent Labs  Lab 02/24/17 1452  BNP 204.1*       Patient Profile     51 y.o. male admitted with hypertensive urgency, respiratory distress  and cocaine use.  Assessment & Plan   1 Hypertensive urgency-BP remains elevated; continue hydralazine/nitrates and amlodipine 10 mg daily; increase labetalol to 200 mg BID. Decrease lasix to 40 mg daily and add spironolactone 25 mg daily. Follow BP and adjust meds as needed; will hold on ACEI or ARB for now given renal insuff. Check renal dopplers to exclude RAS.  2 Acute pulmonary edema- change lasix to 40 mg daily and add spironolactone as outlined.  3 Stage 3 kidney disease-likely from uncontrolled hypertension. Follow BUN and Cr closely.  4 elevated troponin-no chest pain; elevated troponin felt likely related to hypertensive urgency, pulmonary edema and renal insuff. Echo shows normal LV function. No further ischemia eval.  5 cocaine abuse-needs to avoid  6 tobacco abuse-needs to avoid  7 Hypokalemia-change kdur to 20 meq daily given decreased dose of lasix and addition of spironolactone and follow.  For questions or updates, please contact [facility] Please consult [email or web address] for contact info under Cardiology/STEMI.      Signed, Olga MillersBrian Crenshaw, MD  02/28/2017, 9:11 AM

## 2017-02-28 NOTE — Progress Notes (Signed)
Provided pt with a MATCH, explained it can be used once year with a $3 copay for each med exludes Narcotics, and insulin pens.

## 2017-02-28 NOTE — Plan of Care (Signed)
BP are still elevated. BP meds given for the night. Pt NPO after midnight for Vascular study 12/21. Continuing current plan of care as ordered.

## 2017-02-28 NOTE — Progress Notes (Signed)
PROGRESS NOTE    Alex Liu  UJW:[id number] DOB: 1965-04-20 DOA: 02/24/2017 PCP: Default, Provider, MD     Brief Narrative:  Alex Liu is a 51 year old African-American male with history significant for hypertension not on medication does not have a PCP who presented with not feeling well and possible syncopal episode.  Urine toxicology positive for cocaine and cannabinoids.  Started on nitro glycerin drip for the management of hypertensive urgency. IV Lasix for acute pulmonary edema, switched to PO. Cardiology have been consulted and following.  Assessment & Plan:   Active Problems:   Acute pulmonary edema (HCC)   Hypertensive heart disease with acute diastolic congestive heart failure (HCC)   Elevated serum creatinine  Hypertensive urgency -Started on nitroglycerin drip with improvement in blood pressure.  Discontinued nitroglycerin drip and started Imdur.  Continue Lasix, hydralazine, started on norvasc as well as labetalol. Added spironolactone today.  No ACE inhibitor now because of renal insufficiency.   -Cardiology following  -Checking renal artery duplex rule out renal artery stenosis   Acute pulmonary edema linked with hypertensive urgency and acute diastolic heart failure  -Lasix 40mg  PO QD   Elevated troponin without chest pain in the setting of hypertensive urgency, renal insufficiency, pulmonary edema -Echo EF 50-55%, grade 1 diastolic dysfunction  -No further ischemia evaluation per Cardiology   Cocaine abuse -Cessation counseling  Elevated creatinine likely chronic kidney disease in the setting of uncontrolled hypertension -No recent baseline creatinine available.  UA showed WBC and red blood cells. Renal US showed left renal simple cyst.  No other abnormality seen. Avoid nephrotoxins.     DVT prophylaxis: lovenox Code Status: full Family Communication: no family at bedside Disposition Plan: pending improvement in BP    Consultants:    Cardiology  Procedures:   None  Antimicrobials:  Anti-infectives (From admission, onward)   None       Subjective: No complaints this morning aside from food. No chest pain.    Objective: Vitals:   02/27/17 1708 02/27/17 1950 02/28/17 0417 02/28/17 0825  BP: (!) 165/102 (!) 183/110 (!) 180/107 (!) 185/117  Pulse: 89 90 86   Resp:  19 (!) 24   Temp: 99.1 F (37.3 C) 99.2 F (37.3 C) 98.7 F (37.1 C)   TempSrc: Oral Oral Oral   SpO2: 98% 95% 98%   Weight:   105.8 kg (233 lb 3.2 oz)   Height:        Intake/Output Summary (Last 24 hours) at 02/28/2017 1208 Last data filed at 02/28/2017 0900 Gross per 24 hour  Intake 920 ml  Output -  Net 920 ml   Filed Weights   02/26/17 0500 02/27/17 0448 02/28/17 0417  Weight: 105.1 kg (231 lb 11.2 oz) 106 kg (233 lb 9.6 oz) 105.8 kg (233 lb 3.2 oz)    Examination:  General exam: Appears calm and comfortable  Respiratory system: Clear to auscultation. Respiratory effort normal. Cardiovascular system: S1 & S2 heard, RRR. No JVD, murmurs, rubs, gallops or clicks. No pedal edema. Gastrointestinal system: Abdomen is nondistended, soft and nontender. No organomegaly or masses felt. Normal bowel sounds heard. Central nervous system: Alert and oriented. No focal neurological deficits. Extremities: Symmetric 5 x 5 power. Skin: No rashes, lesions or ulcers Psychiatry: Judgement and insight appear normal. Mood & affect appropriate.   Data Reviewed: I have personally reviewed following labs and imaging studies  CBC: Recent Labs  Lab 02/24/17 1452 02/25/17 0040  WBC 8.9 10.7*  NEUTROABS 6.7  --  HGB 13.4 12.0*  HCT 41.1 36.4*  MCV 91.7 90.1  PLT 254 229   Basic Metabolic Panel: Recent Labs  Lab 02/24/17 1452 02/24/17 1853 02/25/17 0040 02/26/17 0543 02/27/17 0313 02/28/17 0644  NA 137  --  138 139 140 139  K 3.4*  --  3.2* 3.8 3.4* 3.8  CL 103  --  100* 103 104 105  CO2 21*  --  29 25 26 26   GLUCOSE 219*  --   112* 97 103* 109*  BUN 18  --  24* 24* 20 19  CREATININE 1.95*  --  2.03* 1.98* 1.83* 1.79*  CALCIUM 8.2*  --  8.2* 8.6* 8.6* 8.9  MG  --  2.0  --   --   --   --   PHOS  --  2.7  --   --   --   --    GFR: Estimated Creatinine Clearance: 62.1 mL/min (A) (by C-G formula based on SCr of 1.79 mg/dL (H)). Liver Function Tests: Recent Labs  Lab 02/24/17 1452 02/25/17 0040  AST 69* 43*  ALT 45 38  ALKPHOS 116 87  BILITOT 0.5 0.8  PROT 6.6 5.9*  ALBUMIN 3.6 3.2*   No results for input(s): LIPASE, AMYLASE in the last 168 hours. No results for input(s): AMMONIA in the last 168 hours. Coagulation Profile: No results for input(s): INR, PROTIME in the last 168 hours. Cardiac Enzymes: Recent Labs  Lab 02/24/17 1452 02/24/17 1853 02/24/17 2015  TROPONINI 0.08* 0.28* 0.31*   BNP (last 3 results) No results for input(s): PROBNP in the last 8760 hours. HbA1C: No results for input(s): HGBA1C in the last 72 hours. CBG: No results for input(s): GLUCAP in the last 168 hours. Lipid Profile: No results for input(s): CHOL, HDL, LDLCALC, TRIG, CHOLHDL, LDLDIRECT in the last 72 hours. Thyroid Function Tests: No results for input(s): TSH, T4TOTAL, FREET4, T3FREE, THYROIDAB in the last 72 hours. Anemia Panel: No results for input(s): VITAMINB12, FOLATE, FERRITIN, TIBC, IRON, RETICCTPCT in the last 72 hours. Sepsis Labs: No results for input(s): PROCALCITON, LATICACIDVEN in the last 168 hours.  Recent Results (from the past 240 hour(s))  MRSA PCR Screening     Status: None   Collection Time: 02/24/17  6:05 PM  Result Value Ref Range Status   MRSA by PCR NEGATIVE NEGATIVE Final    Comment:        The GeneXpert MRSA Assay (FDA approved for NASAL specimens only), is one component of a comprehensive MRSA colonization surveillance program. It is not intended to diagnose MRSA infection nor to guide or monitor treatment for MRSA infections.        Radiology Studies: No results  found.    Scheduled Meds: . amLODipine  10 mg Oral Daily  . aspirin EC  325 mg Oral Daily  . enoxaparin (LOVENOX) injection  40 mg Subcutaneous Q24H  . [START ON 03/01/2017] furosemide  40 mg Oral Daily  . hydrALAZINE  100 mg Oral Q8H  . isosorbide mononitrate  60 mg Oral Daily  . labetalol  200 mg Oral BID  . [START ON 03/01/2017] potassium chloride  20 mEq Oral Daily  . sodium chloride flush  3 mL Intravenous Q12H  . spironolactone  25 mg Oral Daily   Continuous Infusions: . sodium chloride 250 mL (02/24/17 1900)     LOS: 4 days    Time spent: 20 minutes   Noralee StainJennifer May Ozment, DO [facility] [email or web address] Password [email or web address] 02/28/2017, 12:08 PM

## 2017-03-01 ENCOUNTER — Inpatient Hospital Stay (HOSPITAL_COMMUNITY): Payer: Self-pay

## 2017-03-01 DIAGNOSIS — I10 Essential (primary) hypertension: Secondary | ICD-10-CM

## 2017-03-01 LAB — BASIC METABOLIC PANEL
Anion gap: 8 (ref 5–15)
BUN: 21 mg/dL — ABNORMAL HIGH (ref 6–20)
CO2: 24 mmol/L (ref 22–32)
Calcium: 8.7 mg/dL — ABNORMAL LOW (ref 8.9–10.3)
Chloride: 105 mmol/L (ref 101–111)
Creatinine, Ser: 1.85 mg/dL — ABNORMAL HIGH (ref 0.61–1.24)
GFR calc Af Amer: 47 mL/min — ABNORMAL LOW (ref >60)
GFR calc non Af Amer: 41 mL/min — ABNORMAL LOW (ref >60)
Glucose, Bld: 100 mg/dL — ABNORMAL HIGH (ref 65–99)
Potassium: 3.9 mmol/L (ref 3.5–5.1)
Sodium: 137 mmol/L (ref 135–145)

## 2017-03-01 MED ORDER — SPIRONOLACTONE 25 MG PO TABS: 25.0000 mg | ORAL_TABLET | Freq: Every day | ORAL | 0 refills | Status: DC

## 2017-03-01 MED ORDER — FUROSEMIDE 40 MG PO TABS: 40.0000 mg | ORAL_TABLET | Freq: Every day | ORAL | 0 refills | Status: DC

## 2017-03-01 MED ORDER — HYDRALAZINE HCL 100 MG PO TABS: 100.0000 mg | ORAL_TABLET | Freq: Three times a day (TID) | ORAL | 0 refills | Status: DC

## 2017-03-01 MED ORDER — ASPIRIN 325 MG PO TBEC: 325.0000 mg | DELAYED_RELEASE_TABLET | Freq: Every day | ORAL | 0 refills | Status: DC

## 2017-03-01 MED ORDER — LABETALOL HCL 300 MG PO TABS
300.0000 mg | ORAL_TABLET | Freq: Two times a day (BID) | ORAL | 0 refills | Status: DC
Start: 2017-03-01 — End: 2017-03-01

## 2017-03-01 MED ORDER — POTASSIUM CHLORIDE CRYS ER 20 MEQ PO TBCR: 20.0000 meq | EXTENDED_RELEASE_TABLET | Freq: Every day | ORAL | 0 refills | Status: DC

## 2017-03-01 MED ORDER — LABETALOL HCL 300 MG PO TABS: 300.0000 mg | ORAL_TABLET | Freq: Two times a day (BID) | ORAL | 0 refills | Status: DC

## 2017-03-01 MED ORDER — AMLODIPINE BESYLATE 10 MG PO TABS: 10.0000 mg | ORAL_TABLET | Freq: Every day | ORAL | 0 refills | Status: DC

## 2017-03-01 MED ORDER — ISOSORBIDE MONONITRATE ER 60 MG PO TB24: 60.0000 mg | ORAL_TABLET | Freq: Every day | ORAL | 0 refills | Status: DC

## 2017-03-01 MED ORDER — LABETALOL HCL 200 MG PO TABS: 300.0000 mg | ORAL_TABLET | Freq: Two times a day (BID) | ORAL | Status: DC

## 2017-03-01 MED ORDER — LABETALOL HCL 100 MG PO TABS
100.0000 mg | ORAL_TABLET | Freq: Once | ORAL | Status: AC
  Administered 2017-03-01: 100 mg via ORAL
  Filled 2017-03-01: qty 1

## 2017-03-01 NOTE — Progress Notes (Signed)
Progress Note  Patient Name: Alex Liu Date of Encounter: 03/01/2017  Primary Cardiologist: Dr SwazilandJordan  Subjective   No CP or dyspnea; no HA  Inpatient Medications    Scheduled Meds: . amLODipine  10 mg Oral Daily  . aspirin EC  325 mg Oral Daily  . enoxaparin (LOVENOX) injection  40 mg Subcutaneous Q24H  . furosemide  40 mg Oral Daily  . hydrALAZINE  100 mg Oral Q8H  . isosorbide mononitrate  60 mg Oral Daily  . labetalol  200 mg Oral BID  . potassium chloride  20 mEq Oral Daily  . sodium chloride flush  3 mL Intravenous Q12H  . spironolactone  25 mg Oral Daily   Continuous Infusions: . sodium chloride 250 mL (02/24/17 1900)   PRN Meds: sodium chloride, acetaminophen, cyclobenzaprine, ipratropium-albuterol, sodium chloride flush    Telemetry personally reviewed; sinus  Vital Signs    Vitals:   02/28/17 1122 02/28/17 1431 02/28/17 2120 03/01/17 0500  BP: (!) 163/100 (!) 154/102 (!) 160/101 (!) 169/113  Pulse:  91 87 72  Resp:      Temp:  100.3 F (37.9 C) 99.1 F (37.3 C) 98.3 F (36.8 C)  TempSrc:  Oral Oral Oral  SpO2:  99% 99% 99%  Weight:    233 lb 1 oz (105.7 kg)  Height:        Intake/Output Summary (Last 24 hours) at 03/01/2017 0955 Last data filed at 02/28/2017 2140 Gross per 24 hour  Intake 960 ml  Output -  Net 960 ml   Filed Weights   02/27/17 0448 02/28/17 0417 03/01/17 0500  Weight: 233 lb 9.6 oz (106 kg) 233 lb 3.2 oz (105.8 kg) 233 lb 1 oz (105.7 kg)    Physical Exam   GEN: WD/WN NAD Neck: No JVD Cardiac: RRR Respiratory: CTA; no wheeze GI: Soft, NT/ND, no masses MS: no edema Neuro:  Grossly intact   Labs    Chemistry Recent Labs  Lab 02/24/17 1452 02/25/17 0040  02/27/17 0313 02/28/17 0644 03/01/17 0441  NA 137 138   < > 140 139 137  K 3.4* 3.2*   < > 3.4* 3.8 3.9  CL 103 100*   < > 104 105 105  CO2 21* 29   < > 26 26 24   GLUCOSE 219* 112*   < > 103* 109* 100*  BUN 18 24*   < > 20 19 21*  CREATININE  1.95* 2.03*   < > 1.83* 1.79* 1.85*  CALCIUM 8.2* 8.2*   < > 8.6* 8.9 8.7*  PROT 6.6 5.9*  --   --   --   --   ALBUMIN 3.6 3.2*  --   --   --   --   AST 69* 43*  --   --   --   --   ALT 45 38  --   --   --   --   ALKPHOS 116 87  --   --   --   --   BILITOT 0.5 0.8  --   --   --   --   GFRNONAA 38* 37*   < > 41* 43* 41*  GFRAA 44* 42*   < > 48* 49* 47*  ANIONGAP 13 9   < > 10 8 8    < > = values in this interval not displayed.     Hematology Recent Labs  Lab 02/24/17 1452 02/25/17 0040  WBC 8.9 10.7*  RBC  4.48 4.04*  HGB 13.4 12.0*  HCT 41.1 36.4*  MCV 91.7 90.1  MCH 29.9 29.7  MCHC 32.6 33.0  RDW 14.7 14.8  PLT 254 229    Cardiac Enzymes Recent Labs  Lab 02/24/17 1452 02/24/17 1853 02/24/17 2015  TROPONINI 0.08* 0.28* 0.31*    BNP Recent Labs  Lab 02/24/17 1452  BNP 204.1*       Patient Profile     51 y.o. male admitted with hypertensive urgency, respiratory distress and cocaine use.  Assessment & Plan   1 Hypertensive urgency-BP remains elevated; continue hydralazine/nitrates and amlodipine; increase labetalol to 300 mg BID. Continue lasix 40 mg daily and spironolactone 25 mg daily. Follow BP and adjust meds as needed; will hold on ACEI or ARB for now given renal insuff. Renal dopplers preliminary with no RAS. If BP reasonable later today pt could be DCed and fu with APP in one week and Dr SwazilandJordan in 8-12 weeks  2 Acute pulmonary edema- resolved; continue present dose of diuretics  3 Stage 3 kidney disease-likely from uncontrolled hypertension. Check bmet one week after DC.  4 elevated troponin-no plans for further ischemia eval as outlined previously  5 cocaine abuse-needs to avoid  6 tobacco abuse-needs to avoid  7 Hypokalemia-check bmet one week after DC.  For questions or updates, please contact [facility] Please consult [email or web address] for contact info under Cardiology/STEMI.      Signed, Olga MillersBrian Donise Woodle, MD  03/01/2017, 9:55 AM

## 2017-03-01 NOTE — Discharge Summary (Signed)
Physician Discharge Summary  Alex PiggsMonte L Beilfuss WUJ:[id number] DOB: 1965-07-07 DOA: 02/24/2017  PCP: Default, Provider, MD  Admit date: 02/24/2017 Discharge date: 03/01/2017  Admitted From: Home Disposition:  Home  Recommendations for Outpatient Follow-up:  1. Follow up with PCP in 1 week 2. Follow up with Cardiology in 1 week, then Dr. SwazilandJordan in 8-12 weeks  3. Please obtain BMP in 1 week  Discharge Condition: Stable, improved CODE STATUS: Full  Diet recommendation: Heart healthy   Brief/Interim Summary: Alex PiggsMonte L Hsia is a 51 year old African-American male with history significant for hypertension not on medication does not have a PCP who presented with not feeling well and possible syncopal episode. Urine toxicology positive for cocaine and cannabinoids. Started on nitro glycerin drip for the management of hypertensive urgency.IV Lasix for acute pulmonary edema, switched to PO. Cardiology have been consulted and following, medication changes made for improved BP control.   Discharge Diagnoses:  Active Problems:   Acute pulmonary edema (HCC)   Hypertensive heart disease with acute diastolic congestive heart failure (HCC)   Elevated serum creatinine  Hypertensive urgency -Started on nitroglycerin drip with improvement in blood pressure.Discontinued nitroglycerin drip and started Imdur.  -Cardiology consulted   -Renal artery duplex negative for renal artery stenosis -Prescribed hydralazine, imdur, norvasc, labetalol, lasix, spironolactone. No ACE due to renal insufficiency. BP prior to discharge was 142/91. Discussed checking BP at home on regular basis. Will need follow up with cardiology  Acute pulmonary edema linked with hypertensive urgency and acute diastolic heart failure  -Lasix 40mg  PO QD   Elevated troponin without chest pain in the setting of hypertensive urgency, renal insufficiency, pulmonary edema -Echo EF 50-55%, grade 1 diastolic dysfunction  -No further  ischemia evaluation per Cardiology   Cocaine abuse -Cessation counseling  Elevated creatinine likely chronic kidney disease in the setting of uncontrolled hypertension -No recent baseline creatinine available. UA showed WBC and red blood cells. Renal US showed left renal simple cyst. No other abnormality seen.Avoid nephrotoxins.      Discharge Instructions  Discharge Instructions    Call MD for:  difficulty breathing, headache or visual disturbances   Complete by:  As directed    Call MD for:  extreme fatigue   Complete by:  As directed    Call MD for:  hives   Complete by:  As directed    Call MD for:  persistant dizziness or light-headedness   Complete by:  As directed    Call MD for:  persistant nausea and vomiting   Complete by:  As directed    Call MD for:  severe uncontrolled pain   Complete by:  As directed    Call MD for:  temperature >100.4   Complete by:  As directed    Diet - low sodium heart healthy   Complete by:  As directed    Discharge instructions   Complete by:  As directed    You were cared for by a hospitalist during your hospital stay. If you have any questions about your discharge medications or the care you received while you were in the hospital after you are discharged, you can call the unit and asked to speak with the hospitalist on call if the hospitalist that took care of you is not available. Once you are discharged, your primary care physician will handle any further medical issues. Please note that NO REFILLS for any discharge medications will be authorized once you are discharged, as it is imperative that you return to your  primary care physician (or establish a relationship with a primary care physician if you do not have one) for your aftercare needs so that they can reassess your need for medications and monitor your lab values.   Increase activity slowly   Complete by:  As directed      Allergies as of 03/01/2017   No Known Allergies      Medication List    TAKE these medications   amLODipine 10 MG tablet Commonly known as:  NORVASC Take 1 tablet (10 mg total) by mouth daily. Start taking on:  03/02/2017   aspirin 325 MG EC tablet Take 1 tablet (325 mg total) by mouth daily. Start taking on:  03/02/2017   furosemide 40 MG tablet Commonly known as:  LASIX Take 1 tablet (40 mg total) by mouth daily. Start taking on:  03/02/2017   hydrALAZINE 100 MG tablet Commonly known as:  APRESOLINE Take 1 tablet (100 mg total) by mouth every 8 (eight) hours.   isosorbide mononitrate 60 MG 24 hr tablet Commonly known as:  IMDUR Take 1 tablet (60 mg total) by mouth daily. Start taking on:  03/02/2017   labetalol 300 MG tablet Commonly known as:  NORMODYNE Take 1 tablet (300 mg total) by mouth 2 (two) times daily.   Misc. Devices Misc CPAP   potassium chloride SA 20 MEQ tablet Commonly known as:  K-DUR,KLOR-CON Take 1 tablet (20 mEq total) by mouth daily. Start taking on:  03/02/2017   spironolactone 25 MG tablet Commonly known as:  ALDACTONE Take 1 tablet (25 mg total) by mouth daily. Start taking on:  03/02/2017      Follow-up Information    [facility] Follow up on 03/06/2017.   Specialty:  Internal Medicine Why:  at 09:30 am. Please bring your photo ID, list of medications, and arrive 15 minutes early. You will establish a primary care provider with this visit.  Contact information: [address] [place] [address] [phone]       [facility] [employer]. Schedule an appointment as soon as possible for a visit in 1 week(s).   Specialty:  Cardiology Why:  Follow up with APP in 1 week, then Dr. Swaziland in 8-12 weeks  Contact information: [address] [place] [address] [phone]         No Known Allergies  Consultations:  Cardiology    Procedures/Studies: US Renal  Result Date: 02/25/2017 CLINICAL DATA:   Elevated cm creatinine. EXAM: RENAL / URINARY TRACT ULTRASOUND COMPLETE COMPARISON:  Ultrasound of February 28, 2012. FINDINGS: Right Kidney: Length: 10.7 cm. Echogenicity within normal limits. No mass or hydronephrosis visualized. Left Kidney: Length: 9.5 cm. 2.4 cm simple cyst seen in midpole. Echogenicity within normal limits. No mass or hydronephrosis visualized. Bladder: Appears normal for degree of bladder distention. IMPRESSION: Left renal simple cyst.  No other abnormality seen. Electronically Signed   By: Lupita Raider, M.D.   On: 02/25/2017 14:06   Dg Chest Portable 1 View  Result Date: 02/24/2017 CLINICAL DATA:  Respiratory distress EXAM: PORTABLE CHEST 1 VIEW COMPARISON:  10/29/2014 FINDINGS: Cardiac shadow is enlarged but stable. Bibasilar infiltrative changes are noted right greater than left. Mild vascular congestion is noted as well. Some atelectatic changes are noted in the left mid lung. No sizable effusion is seen. IMPRESSION: New bibasilar infiltrates right greater than left. Mild vascular congestion as well. Electronically Signed   By: Alcide Clever  M.D.   On: 02/24/2017 15:27    Echo Study Conclusions  - Left ventricle: The cavity size was normal. There was mild   concentric hypertrophy. Systolic function was normal. The   estimated ejection fraction was in the range of 50% to 55%. There   is akinesis of the mid-apicalinferoseptal myocardium. Doppler   parameters are consistent with abnormal left ventricular   relaxation (grade 1 diastolic dysfunction). - Aortic valve: There was mild regurgitation. - Left atrium: The atrium was mildly dilated. - Right atrium: The atrium was mildly dilated. - Tricuspid valve: There was trivial regurgitation.     Vascular ultrasound  Renal Artery Duplex has been completed.  Findings suggest <60% renal artery stenosis bilaterally.    Discharge Exam: Vitals:   03/01/17 0500 03/01/17 1219  BP: (!) 169/113 (!) 142/91  Pulse: 72    Resp:    Temp: 98.3 F (36.8 C)   SpO2: 99%    Vitals:   02/28/17 1431 02/28/17 2120 03/01/17 0500 03/01/17 1219  BP: (!) 154/102 (!) 160/101 (!) 169/113 (!) 142/91  Pulse: 91 87 72   Resp:      Temp: 100.3 F (37.9 C) 99.1 F (37.3 C) 98.3 F (36.8 C)   TempSrc: Oral Oral Oral   SpO2: 99% 99% 99%   Weight:   105.7 kg (233 lb 1 oz)   Height:        General: Pt is alert, awake, not in acute distress Cardiovascular: RRR, S1/S2 +, no rubs, no gallops Respiratory: CTA bilaterally, no wheezing, no rhonchi Abdominal: Soft, NT, ND, bowel sounds + Extremities: no edema, no cyanosis    The results of significant diagnostics from this hospitalization (including imaging, microbiology, ancillary and laboratory) are listed below for reference.     Microbiology: Recent Results (from the past 240 hour(s))  MRSA PCR Screening     Status: None   Collection Time: 02/24/17  6:05 PM  Result Value Ref Range Status   MRSA by PCR NEGATIVE NEGATIVE Final    Comment:        The GeneXpert MRSA Assay (FDA approved for NASAL specimens only), is one component of a comprehensive MRSA colonization surveillance program. It is not intended to diagnose MRSA infection nor to guide or monitor treatment for MRSA infections.      Labs: BNP (last 3 results) Recent Labs    02/24/17 1452  BNP 204.1*   Basic Metabolic Panel: Recent Labs  Lab 02/24/17 1853 02/25/17 0040 02/26/17 0543 02/27/17 0313 02/28/17 0644 03/01/17 0441  NA  --  138 139 140 139 137  K  --  3.2* 3.8 3.4* 3.8 3.9  CL  --  100* 103 104 105 105  CO2  --  29 25 26 26 24   GLUCOSE  --  112* 97 103* 109* 100*  BUN  --  24* 24* 20 19 21*  CREATININE  --  2.03* 1.98* 1.83* 1.79* 1.85*  CALCIUM  --  8.2* 8.6* 8.6* 8.9 8.7*  MG 2.0  --   --   --   --   --   PHOS 2.7  --   --   --   --   --    Liver Function Tests: Recent Labs  Lab 02/24/17 1452 02/25/17 0040  AST 69* 43*  ALT 45 38  ALKPHOS 116 87  BILITOT 0.5  0.8  PROT 6.6 5.9*  ALBUMIN 3.6 3.2*   No results for input(s): LIPASE, AMYLASE in the last 168  hours. No results for input(s): AMMONIA in the last 168 hours. CBC: Recent Labs  Lab 02/24/17 1452 02/25/17 0040  WBC 8.9 10.7*  NEUTROABS 6.7  --   HGB 13.4 12.0*  HCT 41.1 36.4*  MCV 91.7 90.1  PLT 254 229   Cardiac Enzymes: Recent Labs  Lab 02/24/17 1452 02/24/17 1853 02/24/17 2015  TROPONINI 0.08* 0.28* 0.31*   BNP: Invalid input(s): POCBNP CBG: No results for input(s): GLUCAP in the last 168 hours. D-Dimer No results for input(s): DDIMER in the last 72 hours. Hgb A1c No results for input(s): HGBA1C in the last 72 hours. Lipid Profile No results for input(s): CHOL, HDL, LDLCALC, TRIG, CHOLHDL, LDLDIRECT in the last 72 hours. Thyroid function studies No results for input(s): TSH, T4TOTAL, T3FREE, THYROIDAB in the last 72 hours.  Invalid input(s): FREET3 Anemia work up No results for input(s): VITAMINB12, FOLATE, FERRITIN, TIBC, IRON, RETICCTPCT in the last 72 hours. Urinalysis    Component Value Date/Time   COLORURINE STRAW (A) 02/24/2017 1452   APPEARANCEUR CLEAR 02/24/2017 1452   LABSPEC 1.008 02/24/2017 1452   PHURINE 7.0 02/24/2017 1452   GLUCOSEU 150 (A) 02/24/2017 1452   HGBUR SMALL (A) 02/24/2017 1452   BILIRUBINUR NEGATIVE 02/24/2017 1452   KETONESUR NEGATIVE 02/24/2017 1452   PROTEINUR 100 (A) 02/24/2017 1452   UROBILINOGEN 1.0 02/28/2012 1037   NITRITE NEGATIVE 02/24/2017 1452   LEUKOCYTESUR NEGATIVE 02/24/2017 1452   Sepsis Labs Invalid input(s): PROCALCITONIN,  WBC,  LACTICIDVEN Microbiology Recent Results (from the past 240 hour(s))  MRSA PCR Screening     Status: None   Collection Time: 02/24/17  6:05 PM  Result Value Ref Range Status   MRSA by PCR NEGATIVE NEGATIVE Final    Comment:        The GeneXpert MRSA Assay (FDA approved for NASAL specimens only), is one component of a comprehensive MRSA colonization surveillance program. It is  not intended to diagnose MRSA infection nor to guide or monitor treatment for MRSA infections.      Time coordinating discharge: 40 minutes  SIGNED:  Noralee StainJennifer Arryana Tolleson, DO [facility] Pager [phone]  If 7PM-7AM, please contact night-coverage [email or web address] Password [email or web address] 03/01/2017, 3:00 PM

## 2017-03-01 NOTE — Progress Notes (Signed)
*  PRELIMINARY RESULTS* Vascular Ultrasound Renal Artery Duplex has been completed.   Findings suggest <60% renal artery stenosis bilaterally.  03/01/2017 9:22 AM Gertie FeyMichelle Daryl Quiros, BS, RVT, RDCS, RDMS

## 2017-03-01 NOTE — Progress Notes (Signed)
Discharge summary reviewed with pt and spouse at bedside. All questions addressed and pt expresses no concerns at this time. No change since previous assessment. PIV removed and pt discharged to lobby with wife via wheelchair and pt transporter.

## 2017-03-01 NOTE — Discharge Instructions (Signed)

## 2017-03-06 ENCOUNTER — Ambulatory Visit (INDEPENDENT_AMBULATORY_CARE_PROVIDER_SITE_OTHER): Payer: Self-pay | Admitting: Family Medicine

## 2017-03-06 ENCOUNTER — Encounter: Payer: Self-pay | Admitting: Family Medicine

## 2017-03-06 VITALS — BP 114/72 | HR 72 | Temp 97.7°F | Resp 16 | Ht 72.0 in | Wt 245.0 lb

## 2017-03-06 DIAGNOSIS — I110 Hypertensive heart disease with heart failure: Secondary | ICD-10-CM

## 2017-03-06 DIAGNOSIS — I10 Essential (primary) hypertension: Secondary | ICD-10-CM

## 2017-03-06 DIAGNOSIS — R7989 Other specified abnormal findings of blood chemistry: Secondary | ICD-10-CM

## 2017-03-06 DIAGNOSIS — Z1329 Encounter for screening for other suspected endocrine disorder: Secondary | ICD-10-CM

## 2017-03-06 DIAGNOSIS — Z130 Encounter for screening for diseases of the blood and blood-forming organs and certain disorders involving the immune mechanism: Secondary | ICD-10-CM

## 2017-03-06 DIAGNOSIS — E7849 Other hyperlipidemia: Secondary | ICD-10-CM

## 2017-03-06 LAB — POCT URINALYSIS DIP (DEVICE)
BILIRUBIN URINE: NEGATIVE
GLUCOSE, UA: NEGATIVE mg/dL
HGB URINE DIPSTICK: NEGATIVE
Ketones, ur: NEGATIVE mg/dL
LEUKOCYTES UA: NEGATIVE
NITRITE: NEGATIVE
Protein, ur: NEGATIVE mg/dL
SPECIFIC GRAVITY, URINE: 1.02 (ref 1.005–1.030)
UROBILINOGEN UA: 0.2 mg/dL (ref 0.0–1.0)
pH: 5 (ref 5.0–8.0)

## 2017-03-06 MED ORDER — AMLODIPINE BESYLATE 10 MG PO TABS: 10.0000 mg | ORAL_TABLET | Freq: Every day | ORAL | 1 refills | Status: DC

## 2017-03-06 MED ORDER — ISOSORBIDE MONONITRATE ER 60 MG PO TB24: 60.0000 mg | ORAL_TABLET | Freq: Every day | ORAL | 1 refills | Status: DC

## 2017-03-06 MED ORDER — SPIRONOLACTONE 25 MG PO TABS: 25.0000 mg | ORAL_TABLET | Freq: Every day | ORAL | 1 refills | Status: DC

## 2017-03-06 MED ORDER — LABETALOL HCL 300 MG PO TABS: 300.0000 mg | ORAL_TABLET | Freq: Two times a day (BID) | ORAL | 1 refills | Status: DC

## 2017-03-06 MED ORDER — ASPIRIN 325 MG PO TBEC: 325.0000 mg | DELAYED_RELEASE_TABLET | Freq: Every day | ORAL | 1 refills | Status: DC

## 2017-03-06 MED ORDER — HYDRALAZINE HCL 100 MG PO TABS: 100.0000 mg | ORAL_TABLET | Freq: Three times a day (TID) | ORAL | 0 refills | Status: DC

## 2017-03-06 MED ORDER — FUROSEMIDE 40 MG PO TABS: 40.0000 mg | ORAL_TABLET | Freq: Every day | ORAL | 1 refills | Status: DC

## 2017-03-06 NOTE — Patient Instructions (Signed)
-Limit fluid intake to no more than 1500 ml per day. -If you experiencing greater than 3 lb weight gain within 24 hours, notify our clinic or if after hours report to the Emergency Department. -It is important to keep your blood pressure under control. Monitor blood pressure at home and if you obtain readings greater than 140/90 or <100/60 consecutively over a period of 3 days, notify me here at the office. -Avoid adding table salt or eating food such as can soup or frozen meals which are high in sodium. This increases fluid retention and is likely to worsen your heart failure.  -Read the educational information below thoroughly to aid you in management of your heart failure symptoms.    Evidence supports cardiovascular outcomes improve in those who follow the mediterranean and or DASH diet.The Mediterranean diet emphasizes: Eating primarily plant-based foods, such as fruits and vegetables, whole grains, legumes and nuts. Replacing butter with healthy fats such as olive oil and canola oil. Using herbs and spices instead of salt to flavor foods. The DASH diet eating plan is a diet rich in fruits, vegetables, low fat or nonfat dairy. It also includes mostly whole grains; lean meats, fish and poultry; nuts and beans. It is high fiber and low to moderate in fat. It is a plan that follows US guidelines for sodium content, along with vitamins and minerals.    Heart Failure Heart failure is a condition in which the heart has trouble pumping blood because it has become weak or stiff. This means that the heart does not pump blood efficiently for the body to work well. For some people with heart failure, fluid may back up into the lungs and there may be swelling (edema) in the lower legs. Heart failure is usually a long-term (chronic) condition. It is important for you to take good care of yourself and follow the treatment plan from your health care provider. What are the causes? This condition is caused by some  health problems, including:  High blood pressure (hypertension). Hypertension causes the heart muscle to work harder than normal. High blood pressure eventually causes the heart to become stiff and weak.  Coronary artery disease (CAD). CAD is the buildup of cholesterol and fat (plaques) in the arteries of the heart.  Heart attack (myocardial infarction). Injured tissue, which is caused by the heart attack, does not contract as well and the heart's ability to pump blood is weakened.  Abnormal heart valves. When the heart valves do not open and close properly, the heart muscle must pump harder to keep the blood flowing.  Heart muscle disease (cardiomyopathy or myocarditis). Heart muscle disease is damage to the heart muscle from a variety of causes, such as drug or alcohol abuse, infections, or unknown causes. These can increase the risk of heart failure.  Lung disease. When the lungs do not work properly, the heart must work harder.  What increases the risk? Risk of heart failure increases as a person ages. This condition is also more likely to develop in people who:  Are overweight.  Are male.  Smoke or chew tobacco.  Abuse alcohol or illegal drugs.  Have taken medicines that can damage the heart, such as chemotherapy drugs.  Have diabetes. ? High blood sugar (glucose) is associated with high fat (lipid) levels in the blood. ? Diabetes can also damage tiny blood vessels that carry nutrients to the heart muscle.  Have abnormal heart rhythms.  Have thyroid problems.  Have low blood counts (anemia).  What are the signs or symptoms? Symptoms of this condition include:  Shortness of breath with activity, such as when climbing stairs.  Persistent cough.  Swelling of the feet, ankles, legs, or abdomen.  Unexplained weight gain.  Difficulty breathing when lying flat (orthopnea).  Waking from sleep because of the need to sit up and get more air.  Rapid heartbeat.  Fatigue  and loss of energy.  Feeling light-headed, dizzy, or close to fainting.  Loss of appetite.  Nausea.  Increased urination during the night (nocturia).  Confusion.  How is this diagnosed? This condition is diagnosed based on:  Medical history, symptoms, and a physical exam.  Diagnostic tests, which may include: ? Echocardiogram. ? Electrocardiogram (ECG). ? Chest X-ray. ? Blood tests. ? Exercise stress test. ? Radionuclide scans. ? Cardiac catheterization and angiogram.  How is this treated? Treatment for this condition is aimed at managing the symptoms of heart failure. Medicines, behavioral changes, or other treatments may be necessary to treat heart failure. Medicines These may include:  Angiotensin-converting enzyme (ACE) inhibitors. This type of medicine blocks the effects of a blood protein called angiotensin-converting enzyme. ACE inhibitors relax (dilate) the blood vessels and help to lower blood pressure.  Angiotensin receptor blockers (ARBs). This type of medicine blocks the actions of a blood protein called angiotensin. ARBs dilate the blood vessels and help to lower blood pressure.  Water pills (diuretics). Diuretics cause the kidneys to remove salt and water from the blood. The extra fluid is removed through urination, leaving a lower volume of blood that the heart has to pump.  Beta blockers. These improve heart muscle strength and they prevent the heart from beating too quickly.  Digoxin. This increases the force of the heartbeat.  Healthy behavior changes These may include:  Reaching and maintaining a healthy weight.  Stopping smoking or chewing tobacco.  Eating heart-healthy foods.  Limiting or avoiding alcohol.  Stopping use of street drugs (illegal drugs).  Physical activity.  Other treatments These may include:  Surgery to open blocked coronary arteries or repair damaged heart valves.  Placement of a biventricular pacemaker to improve  heart muscle function (cardiac resynchronization therapy). This device paces both the right ventricle and left ventricle.  Placement of a device to treat serious abnormal heart rhythms (implantable cardioverter defibrillator, or ICD).  Placement of a device to improve the pumping ability of the heart (left ventricular assist device, or LVAD).  Heart transplant. This can cure heart failure, and it is considered for certain patients who do not improve with other therapies.  Follow these instructions at home: Medicines  Take over-the-counter and prescription medicines only as told by your health care provider. Medicines are important in reducing the workload of your heart, slowing the progression of heart failure, and improving your symptoms. ? Do not stop taking your medicine unless your health care provider told you to do that. ? Do not skip any dose of medicine. ? Refill your prescriptions before you run out of medicine. You need your medicines every day. Eating and drinking   Eat heart-healthy foods. Talk with a dietitian to make an eating plan that is right for you. ? Choose foods that contain no trans fat and are low in saturated fat and cholesterol. Healthy choices include fresh or frozen fruits and vegetables, fish, lean meats, legumes, fat-free or low-fat dairy products, and whole-grain or high-fiber foods. ? Limit salt (sodium) if directed by your health care provider. Sodium restriction may reduce symptoms of heart failure.  Ask a dietitian to recommend heart-healthy seasonings. ? Use healthy cooking methods instead of frying. Healthy methods include roasting, grilling, broiling, baking, poaching, steaming, and stir-frying.  Limit your fluid intake if directed by your health care provider. Fluid restriction may reduce symptoms of heart failure. Lifestyle  Stop smoking or using chewing tobacco. Nicotine and tobacco can damage your heart and your blood vessels. Do not use nicotine gum  or patches before talking to your health care provider.  Limit alcohol intake to no more than 1 drink per day for non-pregnant women and 2 drinks per day for men. One drink equals 12 oz of beer, 5 oz of wine, or 1 oz of hard liquor. ? Drinking more than that is harmful to your heart. Tell your health care provider if you drink alcohol several times a week. ? Talk with your health care provider about whether any level of alcohol use is safe for you. ? If your heart has already been damaged by alcohol or you have severe heart failure, drinking alcohol should be stopped completely.  Stop use of illegal drugs.  Lose weight if directed by your health care provider. Weight loss may reduce symptoms of heart failure.  Do moderate physical activity if directed by your health care provider. People who are elderly and people with severe heart failure should consult with a health care provider for physical activity recommendations. Monitor important information  Weigh yourself every day. Keeping track of your weight daily helps you to notice excess fluid sooner. ? Weigh yourself every morning after you urinate and before you eat breakfast. ? Wear the same amount of clothing each time you weigh yourself. ? Record your daily weight. Provide your health care provider with your weight record.  Monitor and record your blood pressure as told by your health care provider.  Check your pulse as told by your health care provider. Dealing with extreme temperatures  If the weather is extremely hot: ? Avoid vigorous physical activity. ? Use air conditioning or fans or seek a cooler location. ? Avoid caffeine and alcohol. ? Wear loose-fitting, lightweight, and light-colored clothing.  If the weather is extremely cold: ? Avoid vigorous physical activity. ? Layer your clothes. ? Wear mittens or gloves, a hat, and a scarf when you go outside. ? Avoid alcohol. General instructions  Manage other health  conditions such as hypertension, diabetes, thyroid disease, or abnormal heart rhythms as told by your health care provider.  Learn to manage stress. If you need help to do this, ask your health care provider.  Plan rest periods when fatigued.  Get ongoing education and support as needed.  Participate in or seek rehabilitation as needed to maintain or improve independence and quality of life.  Stay up to date with immunizations. Keeping current on pneumococcal and influenza immunizations is especially important to prevent respiratory infections.  Keep all follow-up visits as told by your health care provider. This is important. Contact a health care provider if:  You have a rapid weight gain.  You have increasing shortness of breath that is unusual for you.  You are unable to participate in your usual physical activities.  You tire easily.  You cough more than normal, especially with physical activity.  You have any swelling or more swelling in areas such as your hands, feet, ankles, or abdomen.  You are unable to sleep because it is hard to breathe.  You feel like your heart is beating quickly (palpitations).  You become dizzy or  light-headed when you stand up. Get help right away if:  You have difficulty breathing.  You notice or your family notices a change in your awareness, such as having trouble staying awake or having difficulty with concentration.  You have pain or discomfort in your chest.  You have an episode of fainting (syncope). This information is not intended to replace advice given to you by your health care provider. Make sure you discuss any questions you have with your health care provider. Document Released: 02/26/2005 Document Revised: 11/01/2015 Document Reviewed: 09/21/2015 Elsevier Interactive Patient Education  [employer]2018 [employer]

## 2017-03-06 NOTE — Progress Notes (Signed)
Alex Liu, is a 51 y.o. male  RUE:[id number]  JYN:[id number]  DOB - 12-07-1965  CC:  Chief Complaint  Patient presents with  . Establish Care  . Hospitalization Follow-up       HPI: Alex Liu is a 51 y.o. male is here today to establish care and hospital follow-up. Medical history is significant for uncontrolled/untreated hypertension, current everyday smoker, and substance abuse. Until most recently hospitalization, patient has not received any routine preventive medical care. Patient has a very flat disposition today and is a poor historian. He is accompanied by his partner today whom is providing most of the information pertaining to patient current state of illness. On 02/24/2017,  Alex Liu was found unresponsive on the side of the street. CPR was performed and patient was Narcan. He arrived to [facility] ED via EMS and was found to have elevated BP systolic in 200's, pulmonary edema, BNP 204, LVH, elevated creatinine, elevated LFT. USD was positive for cocaine and cannabis.  He was placed on BIPAP. Echo performed revealed grade 1 diastolic dysfunction, with an EF 50-55%. Treatment included nitroglycerin drip for hypertensive urgency. Outpatient management of hypertension included hydralazine, Imdur, norvasc, labetalol, lasix, and spironolactone. BP at discharge 142/90. Serum creatinine remained elevated 1.85. Renal US significant for left renal simple cyst. Alex Liu was discharged from impatient services 03/01/2017. Alex Liu is non-verbal for majority of visit today. He is only nodding and supply "yes" or "no" for responses. He denies experiencing shortness of breath, fatigue with activity, dizziness with positional changes, new weakness, PND, or chest pain. Significant other reports that he is taking his medications consistently as prescribed and checking his blood pressure at least once daily and performing daily weights (could not provide readings). Highest home reading was one day systolic 140's  and diastolic 86'V.  For the most part, she reported this his blood pressure have remained less than 120/80. He denies smoking since hospital discharge. Filed Weights   03/06/17 0946  Weight: 245 lb (111.1 kg)   No Known Allergies Past Medical History:  Diagnosis Date  . Hypertension   . Hypertensive heart disease with acute diastolic congestive heart failure (HCC)    Current Outpatient Medications on File Prior to Visit  Medication Sig Dispense Refill  . amLODipine (NORVASC) 10 MG tablet Take 1 tablet (10 mg total) by mouth daily. 30 tablet 0  . aspirin 325 MG EC tablet Take 1 tablet (325 mg total) by mouth daily. 30 tablet 0  . furosemide (LASIX) 40 MG tablet Take 1 tablet (40 mg total) by mouth daily. 30 tablet 0  . hydrALAZINE (APRESOLINE) 100 MG tablet Take 1 tablet (100 mg total) by mouth every 8 (eight) hours. 90 tablet 0  . isosorbide mononitrate (IMDUR) 60 MG 24 hr tablet Take 1 tablet (60 mg total) by mouth daily. 30 tablet 0  . labetalol (NORMODYNE) 300 MG tablet Take 1 tablet (300 mg total) by mouth 2 (two) times daily. 60 tablet 0  . Misc. Devices MISC CPAP    . potassium chloride SA (K-DUR,KLOR-CON) 20 MEQ tablet Take 1 tablet (20 mEq total) by mouth daily. 30 tablet 0  . spironolactone (ALDACTONE) 25 MG tablet Take 1 tablet (25 mg total) by mouth daily. 30 tablet 0   No current facility-administered medications on file prior to visit.    Family History  Problem Relation Age of Onset  . Hypertension Mother   . CAD Brother 14  . Coronary artery disease Brother   . CAD Brother  Social History   Socioeconomic History  . Marital status: Single    Spouse name: Not on file  . Number of children: Not on file  . Years of education: Not on file  . Highest education level: Not on file  Social Needs  . Financial resource strain: Not on file  . Food insecurity - worry: Not on file  . Food insecurity - inability: Not on file  . Transportation needs - medical: Not on  file  . Transportation needs - non-medical: Not on file  Occupational History  . Not on file  Tobacco Use  . Smoking status: Current Every Day Smoker    Packs/day: 1.00    Types: Cigars  . Smokeless tobacco: Never Used  Substance and Sexual Activity  . Alcohol use: Yes  . Drug use: Yes    Types: Cocaine  . Sexual activity: Not on file  Other Topics Concern  . Not on file  Social History Narrative  . Not on file    Review of Systems: Constitutional: Negative for fever, chills, diaphoresis, activity change, appetite change and fatigue. HENT: Negative for ear pain, nosebleeds, congestion, facial swelling, rhinorrhea, neck pain, neck stiffness and ear discharge.  Eyes: Negative for pain, discharge, redness, itching and visual disturbance. Respiratory: Negative for cough, choking, chest tightness, shortness of breath, wheezing and stridor.  Cardiovascular: Negative for chest pain, palpitations and leg swelling. Gastrointestinal: Negative for abdominal distention. Genitourinary: Negative for dysuria, urgency, frequency, hematuria, flank pain, decreased urine volume, difficulty urinating Musculoskeletal: Negative for back pain, joint swelling, arthralgia and gait problem. Neurological: Negative for dizziness, tremors, seizures, syncope, facial asymmetry, speech difficulty, weakness, light-headedness, numbness and headaches.  Hematological: Negative for adenopathy. Does not bruise/bleed easily. Psychiatric/Behavioral: Negative for suicidal ideations, hallucinations, behavioral problems, confusion, dysphoric mood, decreased concentration and agitation.  Objective:   Vitals:   03/06/17 0946  BP: 114/72  Pulse: 72  Resp: 16  Temp: 97.7 F (36.5 C)  SpO2: 98%    Physical Exam: Constitutional: Patient appears well-developed and well-nourished. No distress. HENT: Normocephalic, atraumatic, External right and left ear normal. Oropharynx is clear and moist.  Eyes: Conjunctivae and EOM  are normal. PERRLA, no scleral icterus. Neck: Normal ROM. Neck supple. No JVD. No tracheal deviation. No thyromegaly. CVS: RRR, S1/S2 +, no murmurs, no gallops, no carotid bruit.  Pulmonary: Effort normal, breath sounds diminished throughout , no stridor, rhonchi, wheezes, rales.  Abdominal: positive abdominal distension, negative tenderness, rebound or guarding.  Musculoskeletal: Normal range of motion. No edema and no tenderness.  Lymphadenopathy: No lymphadenopathy noted, cervical, inguinal or axillary Neuro: Alert. Normal reflexes, muscle tone coordination. No cranial nerve deficit. Skin: Skin is warm and dry. No rash noted. Not diaphoretic. No erythema. No pallor. Psychiatric: Normal mood and affect. Behavior, judgment, thought content normal.  Lab Results  Component Value Date   WBC 10.7 (H) 02/25/2017   HGB 12.0 (L) 02/25/2017   HCT 36.4 (L) 02/25/2017   MCV 90.1 02/25/2017   PLT 229 02/25/2017   Lab Results  Component Value Date   CREATININE 1.85 (H) 03/01/2017   BUN 21 (H) 03/01/2017   NA 137 03/01/2017   K 3.9 03/01/2017   CL 105 03/01/2017   CO2 24 03/01/2017    Lab Results  Component Value Date   HGBA1C 5.7 (H) 02/25/2017   Lipid Panel     Component Value Date/Time   CHOL 186 02/25/2017 0040   TRIG 76 02/25/2017 0040   HDL 40 (L) 02/25/2017 0040  CHOLHDL 4.7 02/25/2017 0040   VLDL 15 02/25/2017 0040   LDLCALC 131 (H) 02/25/2017 0040        Assessment and plan:  1. Hypertensive HF (heart failure) (HCC), new diagnosis, secondary to uncontrolled hypertension. Patient has sustained an approximate 12 lb weight gain since discharge from the hospital. Uncertain if patient is consisently, monitoring weight at home. Will obtain a BNP today. Continue Imdur, spirolactone, and furosemide. Currently scheduled for follow-up with cardiology, 03/18/2017.   2. Elevated serum creatinine, last creatinine 1.85. US renal negative for renal stenosis. Obtaining a CMP today.    3. Screening for deficiency anemia- CBC with Differential  4. Screening for thyroid disorder- Thyroid Panel With TSH  5. Other hyperlipidemia, not at goal of, LDL <70. Current LDL 131, HDL 40 The 10-year ASCVD risk score Denman George DC Jr., et al., 2013) is: 12.5%   Values used to calculate the score:     Age: 17 years     Sex: Male     Is Non-Hispanic African American: Yes     Diabetic: No     Tobacco smoker: Yes     Systolic Blood Pressure: 114 mmHg     Is BP treated: Yes     HDL Cholesterol: 40 mg/dL     Total Cholesterol: 186 mg/dL   Elevated LFT,  likely the cause statin therapy was not initiated. Checking CMP today. Will defer statin therapy for now.  6. Hypertension, essential, controlled stable today. Continue Norvasc, Hydralazine, and Labetalol.   Meds ordered this encounter  Medications  . spironolactone (ALDACTONE) 25 MG tablet    Sig: Take 1 tablet (25 mg total) by mouth daily.    Dispense:  90 tablet    Refill:  1    Order Specific Question:   Supervising Provider    Answer:   Quentin Angst [id number]  . labetalol (NORMODYNE) 300 MG tablet    Sig: Take 1 tablet (300 mg total) by mouth 2 (two) times daily.    Dispense:  180 tablet    Refill:  1    Order Specific Question:   Supervising Provider    Answer:   Quentin Angst [id number]  . isosorbide mononitrate (IMDUR) 60 MG 24 hr tablet    Sig: Take 1 tablet (60 mg total) by mouth daily.    Dispense:  90 tablet    Refill:  1    Order Specific Question:   Supervising Provider    Answer:   Quentin Angst [id number]  . hydrALAZINE (APRESOLINE) 100 MG tablet    Sig: Take 1 tablet (100 mg total) by mouth every 8 (eight) hours.    Dispense:  90 tablet    Refill:  0    Order Specific Question:   Supervising Provider    Answer:   Quentin Angst [id number]  . furosemide (LASIX) 40 MG tablet    Sig: Take 1 tablet (40 mg total) by mouth daily.    Dispense:  90 tablet    Refill:  1    Order Specific  Question:   Supervising Provider    Answer:   Quentin Angst [id number]  . aspirin 325 MG EC tablet    Sig: Take 1 tablet (325 mg total) by mouth daily.    Dispense:  90 tablet    Refill:  1    Order Specific Question:   Supervising Provider    Answer:   Quentin Angst [id number]  . amLODipine (NORVASC) 10 MG  tablet    Sig: Take 1 tablet (10 mg total) by mouth daily.    Dispense:  90 tablet    Refill:  1    Order Specific Question:   Supervising Provider    Answer:   Quentin AngstJEGEDE, OLUGBEMIGA E [id number]]    Orders Placed This Encounter  Procedures  . CBC with Differential  . Comprehensive metabolic panel  . Thyroid Panel With TSH  . Brain natriuretic peptide  . POCT urinalysis dip (device)    -Patient provided a [facility] Financial Assistance Application and recommended completion and submission to assist with medication care.   The patient was given clear instructions to go to ER or return to medical center if symptoms don't improve, worsen or new problems develop. The patient verbalized understanding. The patient was told to call to get lab results if they haven't heard anything in the next week.     Godfrey PickKimberly S. Tiburcio PeaHarris, MSN, FNP-C [facility]  [address] [place], [place] [address] [phone]   This note has been created with Dragon speech recognition software and smart phrase technology. Any transcriptional errors are unintentional.

## 2017-03-07 LAB — CBC WITH DIFFERENTIAL/PLATELET
BASOS: 0 %
Basophils Absolute: 0 10*3/uL (ref 0.0–0.2)
EOS (ABSOLUTE): 0.1 10*3/uL (ref 0.0–0.4)
EOS: 2 %
HEMATOCRIT: 37.9 % (ref 37.5–51.0)
HEMOGLOBIN: 12.1 g/dL — AB (ref 13.0–17.7)
IMMATURE GRANS (ABS): 0 10*3/uL (ref 0.0–0.1)
IMMATURE GRANULOCYTES: 0 %
Lymphocytes Absolute: 1.3 10*3/uL (ref 0.7–3.1)
Lymphs: 23 %
MCH: 28.9 pg (ref 26.6–33.0)
MCHC: 31.9 g/dL (ref 31.5–35.7)
MCV: 91 fL (ref 79–97)
MONOCYTES: 5 %
MONOS ABS: 0.3 10*3/uL (ref 0.1–0.9)
NEUTROS PCT: 70 %
Neutrophils Absolute: 3.9 10*3/uL (ref 1.4–7.0)
Platelets: 361 10*3/uL (ref 150–379)
RBC: 4.18 x10E6/uL (ref 4.14–5.80)
RDW: 15 % (ref 12.3–15.4)
WBC: 5.6 10*3/uL (ref 3.4–10.8)

## 2017-03-07 LAB — COMPREHENSIVE METABOLIC PANEL
A/G RATIO: 1.9 (ref 1.2–2.2)
ALBUMIN: 4 g/dL (ref 3.5–5.5)
ALT: 25 IU/L (ref 0–44)
AST: 17 IU/L (ref 0–40)
Alkaline Phosphatase: 89 IU/L (ref 39–117)
BUN/Creatinine Ratio: 10 (ref 9–20)
BUN: 21 mg/dL (ref 6–24)
Bilirubin Total: <0.2 mg/dL (ref 0.0–1.2)
CALCIUM: 9.1 mg/dL (ref 8.7–10.2)
CO2: 25 mmol/L (ref 20–29)
CREATININE: 2.03 mg/dL — AB (ref 0.76–1.27)
Chloride: 104 mmol/L (ref 96–106)
GFR, EST AFRICAN AMERICAN: 43 mL/min/{1.73_m2} — AB (ref >59)
GFR, EST NON AFRICAN AMERICAN: 37 mL/min/{1.73_m2} — AB (ref >59)
GLOBULIN, TOTAL: 2.1 g/dL (ref 1.5–4.5)
Glucose: 138 mg/dL — ABNORMAL HIGH (ref 65–99)
POTASSIUM: 4.6 mmol/L (ref 3.5–5.2)
SODIUM: 141 mmol/L (ref 134–144)
TOTAL PROTEIN: 6.1 g/dL (ref 6.0–8.5)

## 2017-03-07 LAB — THYROID PANEL WITH TSH
Free Thyroxine Index: 1.5 (ref 1.2–4.9)
T3 Uptake Ratio: 26 % (ref 24–39)
T4 TOTAL: 5.8 ug/dL (ref 4.5–12.0)
TSH: 2.3 u[IU]/mL (ref 0.450–4.500)

## 2017-03-07 LAB — BRAIN NATRIURETIC PEPTIDE: BNP: 40.2 pg/mL (ref 0.0–100.0)

## 2017-03-12 ENCOUNTER — Telehealth: Payer: Self-pay | Admitting: Family Medicine

## 2017-03-12 DIAGNOSIS — N289 Disorder of kidney and ureter, unspecified: Secondary | ICD-10-CM

## 2017-03-12 NOTE — Telephone Encounter (Signed)
Notify the patient that his renal function continues to remain abnormally elevated.  I am referring patient to nephrology for further evaluation of renal function.   Please forward referral along with my [facility].  Patient is uninsured, therefore forward referral and necessary financial assistance application    Godfrey PickKimberly S. Tiburcio PeaHarris, MSN, FNP-C [facility]  [address] [place], [place] [address] [phone]

## 2017-03-18 ENCOUNTER — Encounter: Payer: Self-pay | Admitting: Physician Assistant

## 2017-03-18 ENCOUNTER — Ambulatory Visit (INDEPENDENT_AMBULATORY_CARE_PROVIDER_SITE_OTHER): Payer: Self-pay | Admitting: Physician Assistant

## 2017-03-18 VITALS — BP 118/80 | HR 73 | Ht 72.0 in | Wt 237.0 lb

## 2017-03-18 DIAGNOSIS — R55 Syncope and collapse: Secondary | ICD-10-CM

## 2017-03-18 DIAGNOSIS — F1910 Other psychoactive substance abuse, uncomplicated: Secondary | ICD-10-CM

## 2017-03-18 DIAGNOSIS — E876 Hypokalemia: Secondary | ICD-10-CM

## 2017-03-18 DIAGNOSIS — I5032 Chronic diastolic (congestive) heart failure: Secondary | ICD-10-CM

## 2017-03-18 DIAGNOSIS — I10 Essential (primary) hypertension: Secondary | ICD-10-CM

## 2017-03-18 DIAGNOSIS — R931 Abnormal findings on diagnostic imaging of heart and coronary circulation: Secondary | ICD-10-CM

## 2017-03-18 DIAGNOSIS — N289 Disorder of kidney and ureter, unspecified: Secondary | ICD-10-CM

## 2017-03-18 DIAGNOSIS — N189 Chronic kidney disease, unspecified: Secondary | ICD-10-CM

## 2017-03-18 NOTE — Progress Notes (Signed)
Cardiology Office Note    Date:  03/20/2017   ID:  Alex PiggsMonte L Reister, DOB 09/01/1965, MRN [id number]  PCP:  Bing NeighborsHarris, Kimberly S, FNP  Cardiologist:  Dr. SwazilandJordan  Chief Complaint  Patient presents with  . Follow-up    seen for Dr. SwazilandJordan    History of Present Illness:  Alex Liu is a 52 y.o. male with PMH of HTN, CKD stage III, and history of polysubstance abuse with cocaine, tobacco and EtOH.  He recently was admitted in December 2018 with worsening shortness of breath.  He was on his way home to get his inhaler and apparently lost consciousness.  He was found bystander unconscious lying in the street.  He was given Narcan in the field by EMS with concern of opiate overdose.  On arrival to the ED, he was severely hypertensive with blood pressure of 211/142.  Urine drug test was positive for cocaine, troponin was borderline elevated at 0.08.  BNP was 204.1.  Hemoglobin was normal.  Electrolyte was significant for potassium of 3.4.  He was treated for hypertensive urgency, he is not a candidate for beta-blocker due to cocaine use.  He also diagnosed with acute pulmonary edema and treated with IV Lasix.  Echocardiogram obtained on 02/25/2017 showed EF 50-55%, grade 1 diastolic dysfunction, mild AI, mildly dilated left and right atrium.  Ultrasound Doppler showed less than 60% renal artery stenosis bilaterally.  Patient presents today for cardiology office visit.  He did have a lab work after discharge which shows his renal function is worsening slightly.  I will obtain a basic metabolic panel, if renal function continued to worsen, I will decrease his Lasix to 20 mg daily.  He appears to be euvolemic on physical exam, have no lower extremity edema, his lung is clear and he has no obvious JVD on exam.  He denies ever having chest pain despite the recent echocardiogram showing possible anteroseptal akinesis.  Given lack of chest discomfort, I did not pursue any further ischemic workup.  No further  ischemic evaluation was recommended in the hospital either.  He works in [employer], since he has not had any dizziness or feeling of passing out for the past 2 weeks, I think he is stable to go back to work.   Past Medical History:  Diagnosis Date  . Hypertension   . Hypertensive heart disease with acute diastolic congestive heart failure [facility](HCC)     Past Surgical History:  Procedure Laterality Date  . BRAIN SURGERY      Current Medications: Outpatient Medications Prior to Visit  Medication Sig Dispense Refill  . amLODipine (NORVASC) 10 MG tablet Take 1 tablet (10 mg total) by mouth daily. 90 tablet 1  . aspirin 325 MG EC tablet Take 1 tablet (325 mg total) by mouth daily. 90 tablet 1  . furosemide (LASIX) 40 MG tablet Take 1 tablet (40 mg total) by mouth daily. 90 tablet 1  . hydrALAZINE (APRESOLINE) 100 MG tablet Take 1 tablet (100 mg total) by mouth every 8 (eight) hours. 90 tablet 0  . isosorbide mononitrate (IMDUR) 60 MG 24 hr tablet Take 1 tablet (60 mg total) by mouth daily. 90 tablet 1  . labetalol (NORMODYNE) 300 MG tablet Take 1 tablet (300 mg total) by mouth 2 (two) times daily. 180 tablet 1  . Misc. Devices MISC CPAP    . potassium chloride SA (K-DUR,KLOR-CON) 20 MEQ tablet Take 1 tablet (20 mEq total) by mouth daily. 30 tablet 0  .  spironolactone (ALDACTONE) 25 MG tablet Take 1 tablet (25 mg total) by mouth daily. 90 tablet 1   No facility-administered medications prior to visit.      Allergies:   Patient has no known allergies.   Social History   Socioeconomic History  . Marital status: Single    Spouse name: None  . Number of children: None  . Years of education: None  . Highest education level: None  Social Needs  . Financial resource strain: None  . Food insecurity - worry: None  . Food insecurity - inability: None  . Transportation needs - medical: None  . Transportation needs - non-medical: None  Occupational History  . None  Tobacco Use  .  Smoking status: Current Every Day Smoker    Packs/day: 1.00    Types: Cigars  . Smokeless tobacco: Never Used  Substance and Sexual Activity  . Alcohol use: Yes  . Drug use: Yes    Types: Cocaine  . Sexual activity: None  Other Topics Concern  . None  Social History Narrative  . None     Family History:  The patient's family history includes CAD in his brother; CAD (age of onset: 18) in his brother; Coronary artery disease in his brother; Hypertension in his mother.   ROS:   Please see the history of present illness.    ROS All other systems reviewed and are negative.   PHYSICAL EXAM:   VS:  BP 118/80   Pulse 73   Ht 6' (1.829 m)   Wt 237 lb (107.5 kg)   BMI 32.14 kg/m    GEN: Well nourished, well developed, in no acute distress  HEENT: normal  Neck: no JVD, carotid bruits, or masses Cardiac: RRR; no murmurs, rubs, or gallops,no edema  Respiratory:  clear to auscultation bilaterally, normal work of breathing GI: soft, nontender, nondistended, + BS MS: no deformity or atrophy  Skin: warm and dry, no rash Neuro:  Alert and Oriented x 3, Strength and sensation are intact Psych: euthymic mood, full affect  Wt Readings from Last 3 Encounters:  03/18/17 237 lb (107.5 kg)  03/06/17 245 lb (111.1 kg)  03/01/17 233 lb 1 oz (105.7 kg)      Studies/Labs Reviewed:   EKG:  EKG is not ordered today.    Recent Labs: 02/24/2017: Magnesium 2.0 03/06/2017: ALT 25; BNP 40.2; Hemoglobin 12.1; Platelets 361; TSH 2.300 03/18/2017: BUN 25; Creatinine, Ser 1.99; Potassium 4.5; Sodium 142   Lipid Panel    Component Value Date/Time   CHOL 186 02/25/2017 0040   TRIG 76 02/25/2017 0040   HDL 40 (L) 02/25/2017 0040   CHOLHDL 4.7 02/25/2017 0040   VLDL 15 02/25/2017 0040   LDLCALC 131 (H) 02/25/2017 0040    Additional studies/ records that were reviewed today include:   Echo 02/25/2017 LV EF: 50% -   55%  Study Conclusions  - Left ventricle: The cavity size was normal.  There was mild   concentric hypertrophy. Systolic function was normal. The   estimated ejection fraction was in the range of 50% to 55%. There   is akinesis of the mid-apicalinferoseptal myocardium. Doppler   parameters are consistent with abnormal left ventricular   relaxation (grade 1 diastolic dysfunction). - Aortic valve: There was mild regurgitation. - Left atrium: The atrium was mildly dilated. - Right atrium: The atrium was mildly dilated. - Tricuspid valve: There was trivial regurgitation.    ASSESSMENT:    1. Syncope, unspecified syncope type  2. Essential hypertension   3. Acute on chronic renal insufficiency   4. Polysubstance abuse (HCC)   5. Hypokalemia   6. Chronic diastolic heart failure (HCC)   7. Abnormal echocardiogram      PLAN:  In order of problems listed above:  1. Syncope: No recent recurrence, since he has been discharged from the hospital 2 weeks ago, he does not have any dizziness, blurred vision or feeling of passing out.  I think he is safe to go back to work.  2. Acute on chronic renal insufficiency: Creatinine worsened slightly prior to discharge, will obtain basic metabolic panel to reassess.  3. Hypokalemia: Potassium was low in the hospital, basic metabolic panel  4. Chronic diastolic heart failure: Euvolemic on physical exam  5. Abnormal echocardiogram: Normal ejection fraction, some wall motion abnormality on the previous echocardiogram, however given lack of symptoms, no further ischemic workup was planned.    Medication Adjustments/Labs and Tests Ordered: Current medicines are reviewed at length with the patient today.  Concerns regarding medicines are outlined above.  Medication changes, Labs and Tests ordered today are listed in the Patient Instructions below. Patient Instructions  Medication Instructions:  Your physician recommends that you continue on your current medications as directed. Please refer to the Current Medication  list given to you today.  Labwork: Your physician recommends that you return for lab work in: TODAY-BMET  Testing/Procedures: None   Follow-Up: Your physician recommends that you schedule a follow-up appointment in: 2 months with Dr Swaziland.  Any Other Special Instructions Will Be Listed Below (If Applicable).  If you need a refill on your cardiac medications before your next appointment, please call your pharmacy.     Ramond Dial, [place]  03/20/2017 2:12 PM    [facility] [address] [place], [place], [place]  [address] Phone: [phone]; Fax: [phone]

## 2017-03-18 NOTE — Patient Instructions (Addendum)
Medication Instructions:  Your physician recommends that you continue on your current medications as directed. Please refer to the Current Medication list given to you today.  Labwork: Your physician recommends that you return for lab work in: TODAY-BMET  Testing/Procedures: None   Follow-Up: Your physician recommends that you schedule a follow-up appointment in: 2 months with Dr SwazilandJordan.  Any Other Special Instructions Will Be Listed Below (If Applicable).  If you need a refill on your cardiac medications before your next appointment, please call your pharmacy.

## 2017-03-19 LAB — BASIC METABOLIC PANEL
BUN/Creatinine Ratio: 13 (ref 9–20)
BUN: 25 mg/dL — AB (ref 6–24)
CALCIUM: 9.4 mg/dL (ref 8.7–10.2)
CO2: 21 mmol/L (ref 20–29)
CREATININE: 1.99 mg/dL — AB (ref 0.76–1.27)
Chloride: 102 mmol/L (ref 96–106)
GFR calc Af Amer: 44 mL/min/{1.73_m2} — ABNORMAL LOW (ref >59)
GFR, EST NON AFRICAN AMERICAN: 38 mL/min/{1.73_m2} — AB (ref >59)
Glucose: 87 mg/dL (ref 65–99)
Potassium: 4.5 mmol/L (ref 3.5–5.2)
SODIUM: 142 mmol/L (ref 134–144)

## 2017-03-20 ENCOUNTER — Encounter: Payer: Self-pay | Admitting: Physician Assistant

## 2017-03-20 NOTE — Progress Notes (Signed)
Kidney function stable, potassium stable as well. Make sure to followup with a nephrologist to continue monitor kidney function

## 2017-04-01 MED FILL — ISOSORBIDE MN ER 60 MG TAB: 60 | 30 days supply | Qty: 30 | Fill #0

## 2017-04-01 MED FILL — ?FUROSEMIDE 40 MG TABLET: 40 | 30 days supply | Qty: 30 | Fill #0

## 2017-04-01 MED FILL — hydrALAZINE HCL 100 MG TABS: 100 | 30 days supply | Qty: 90 | Fill #0

## 2017-04-01 MED FILL — LABETALOL HCL 300 MG TABLET: 300 | 30 days supply | Qty: 60 | Fill #0

## 2017-04-01 MED FILL — SPIRONOLACTONE 25 MG TABS: 25 | 30 days supply | Qty: 30 | Fill #0

## 2017-04-01 MED FILL — AMLODIPINE BESYLATE 10 MG T: 10 | 30 days supply | Qty: 30 | Fill #0

## 2017-04-05 ENCOUNTER — Telehealth: Payer: Self-pay | Admitting: Cardiology

## 2017-04-05 MED ORDER — POTASSIUM CHLORIDE CRYS ER 20 MEQ PO TBCR: 20.0000 meq | EXTENDED_RELEASE_TABLET | Freq: Every day | ORAL | 1 refills | Status: DC

## 2017-04-05 NOTE — Telephone Encounter (Signed)
New message      *STAT* If patient is at the pharmacy, call can be transferred to refill team.   1. Which medications need to be refilled? (please list name of each medication and dose if known) potassium chloride SA (K-DUR,KLOR-CON) 20 MEQ tablet  2. Which pharmacy/location (including street and city if local pharmacy) is medication to be sent to?[id number]  3. Do they need a 30 day or 90 day supply? 90

## 2017-05-03 ENCOUNTER — Ambulatory Visit: Payer: Self-pay | Admitting: Family Medicine

## 2017-05-09 ENCOUNTER — Ambulatory Visit: Payer: Self-pay | Admitting: Cardiology

## 2017-05-13 ENCOUNTER — Other Ambulatory Visit: Payer: Self-pay | Admitting: Family Medicine

## 2017-05-13 MED FILL — AMLODIPINE BESYLATE 10 MG T: 10 | 30 days supply | Qty: 30 | Fill #1

## 2017-05-13 MED FILL — ISOSORBIDE MN ER 60 MG TAB: 60 | 30 days supply | Qty: 30 | Fill #1

## 2017-05-13 MED FILL — FUROSEMIDE 40 MG TAB: 40 | 30 days supply | Qty: 30 | Fill #1

## 2017-05-13 MED FILL — LABETALOL HCL 300 MG TABLET: 300 | 30 days supply | Qty: 60 | Fill #1

## 2017-05-13 MED FILL — SPIRONOLACTONE 25 MG TABS: 25 | 30 days supply | Qty: 30 | Fill #1

## 2017-05-14 MED ORDER — HYDRALAZINE HCL 100 MG PO TABS: 100.0000 mg | ORAL_TABLET | Freq: Three times a day (TID) | ORAL | 1 refills | Status: DC

## 2017-05-14 MED FILL — hydrALAZINE HCL 100 MG TABS: 100 | 30 days supply | Qty: 90 | Fill #0

## 2017-05-14 NOTE — Addendum Note (Signed)
Addended by: Bing NeighborsHARRIS, Celso Granja S on: 05/14/2017 07:46 AM   Modules accepted: Orders

## 2017-05-20 NOTE — Progress Notes (Deleted)
Cardiology Office Note    Date:  05/20/2017   ID:  Alex Liu, DOB 1965/06/25, MRN [id number]  PCP:  Bing Neighbors, FNP  Cardiologist:  Dr. Swaziland  No chief complaint on file.   History of Present Illness:  Alex Liu is a 52 y.o. male with PMH of HTN, CKD stage III, and history of polysubstance abuse with cocaine, tobacco and EtOH.  He recently was admitted in December 2018 with worsening shortness of breath.  He was on his way home to get his inhaler and apparently lost consciousness.  He was found bystander unconscious lying in the street.  He was given Narcan in the field by EMS with concern of opiate overdose.  On arrival to the ED, he was severely hypertensive with blood pressure of 211/142.  Urine drug test was positive for cocaine, troponin was borderline elevated at 0.08.  BNP was 204.1.  Hemoglobin was normal.  Electrolyte was significant for potassium of 3.4.  He was treated for hypertensive urgency, he is not a candidate for beta-blocker due to cocaine use.  He also diagnosed with acute pulmonary edema and treated with IV Lasix.  Echocardiogram obtained on 02/25/2017 showed EF 50-55%, grade 1 diastolic dysfunction, mild AI, mildly dilated left and right atrium.  Ultrasound Doppler showed less than 60% renal artery stenosis bilaterally.  Patient presents today for cardiology office visit.  He did have a lab work after discharge which shows his renal function is worsening slightly.  I will obtain a basic metabolic panel, if renal function continued to worsen, I will decrease his Lasix to 20 mg daily.  He appears to be euvolemic on physical exam, have no lower extremity edema, his lung is clear and he has no obvious JVD on exam.  He denies ever having chest pain despite the recent echocardiogram showing possible anteroseptal akinesis.  Given lack of chest discomfort, I did not pursue any further ischemic workup.  No further ischemic evaluation was recommended in the hospital  either.  He works in [employer], since he has not had any dizziness or feeling of passing out for the past 2 weeks, I think he is stable to go back to work.   Past Medical History:  Diagnosis Date  . Hypertension   . Hypertensive heart disease with acute diastolic congestive heart failure [facility])     Past Surgical History:  Procedure Laterality Date  . BRAIN SURGERY      Current Medications: Outpatient Medications Prior to Visit  Medication Sig Dispense Refill  . amLODipine (NORVASC) 10 MG tablet Take 1 tablet (10 mg total) by mouth daily. 90 tablet 1  . aspirin 325 MG EC tablet Take 1 tablet (325 mg total) by mouth daily. 90 tablet 1  . furosemide (LASIX) 40 MG tablet Take 1 tablet (40 mg total) by mouth daily. 90 tablet 1  . hydrALAZINE (APRESOLINE) 100 MG tablet Take 1 tablet (100 mg total) by mouth every 8 (eight) hours. 270 tablet 1  . isosorbide mononitrate (IMDUR) 60 MG 24 hr tablet Take 1 tablet (60 mg total) by mouth daily. 90 tablet 1  . labetalol (NORMODYNE) 300 MG tablet Take 1 tablet (300 mg total) by mouth 2 (two) times daily. 180 tablet 1  . Misc. Devices MISC CPAP    . potassium chloride SA (K-DUR,KLOR-CON) 20 MEQ tablet Take 1 tablet (20 mEq total) by mouth daily. 90 tablet 1  . spironolactone (ALDACTONE) 25 MG tablet Take 1 tablet (25 mg total) by  mouth daily. 90 tablet 1   No facility-administered medications prior to visit.      Allergies:   Patient has no known allergies.   Social History   Socioeconomic History  . Marital status: Single    Spouse name: Not on file  . Number of children: Not on file  . Years of education: Not on file  . Highest education level: Not on file  Social Needs  . Financial resource strain: Not on file  . Food insecurity - worry: Not on file  . Food insecurity - inability: Not on file  . Transportation needs - medical: Not on file  . Transportation needs - non-medical: Not on file  Occupational History  . Not on file    Tobacco Use  . Smoking status: Current Every Day Smoker    Packs/day: 1.00    Types: Cigars  . Smokeless tobacco: Never Used  Substance and Sexual Activity  . Alcohol use: Yes  . Drug use: Yes    Types: Cocaine  . Sexual activity: Not on file  Other Topics Concern  . Not on file  Social History Narrative  . Not on file     Family History:  The patient's family history includes CAD in his brother; CAD (age of onset: 425) in his brother; Coronary artery disease in his brother; Hypertension in his mother.   ROS:   Please see the history of present illness.    ROS All other systems reviewed and are negative.   PHYSICAL EXAM:   VS:  There were no vitals taken for this visit.   GEN: Well nourished, well developed, in no acute distress  HEENT: normal  Neck: no JVD, carotid bruits, or masses Cardiac: RRR; no murmurs, rubs, or gallops,no edema  Respiratory:  clear to auscultation bilaterally, normal work of breathing GI: soft, nontender, nondistended, + BS MS: no deformity or atrophy  Skin: warm and dry, no rash Neuro:  Alert and Oriented x 3, Strength and sensation are intact Psych: euthymic mood, full affect  Wt Readings from Last 3 Encounters:  03/18/17 237 lb (107.5 kg)  03/06/17 245 lb (111.1 kg)  03/01/17 233 lb 1 oz (105.7 kg)      Studies/Labs Reviewed:   EKG:  EKG is not ordered today.    Recent Labs: 02/24/2017: Magnesium 2.0 03/06/2017: ALT 25; BNP 40.2; Hemoglobin 12.1; Platelets 361; TSH 2.300 03/18/2017: BUN 25; Creatinine, Ser 1.99; Potassium 4.5; Sodium 142   Lipid Panel    Component Value Date/Time   CHOL 186 02/25/2017 0040   TRIG 76 02/25/2017 0040   HDL 40 (L) 02/25/2017 0040   CHOLHDL 4.7 02/25/2017 0040   VLDL 15 02/25/2017 0040   LDLCALC 131 (H) 02/25/2017 0040    Additional studies/ records that were reviewed today include:   Echo 02/25/2017 LV EF: 50% -   55%  Study Conclusions  - Left ventricle: The cavity size was normal. There  was mild   concentric hypertrophy. Systolic function was normal. The   estimated ejection fraction was in the range of 50% to 55%. There   is akinesis of the mid-apicalinferoseptal myocardium. Doppler   parameters are consistent with abnormal left ventricular   relaxation (grade 1 diastolic dysfunction). - Aortic valve: There was mild regurgitation. - Left atrium: The atrium was mildly dilated. - Right atrium: The atrium was mildly dilated. - Tricuspid valve: There was trivial regurgitation.    ASSESSMENT:    No diagnosis found.   PLAN:  In  order of problems listed above:  1. Syncope: No recent recurrence, since he has been discharged from the hospital 2 weeks ago, he does not have any dizziness, blurred vision or feeling of passing out.  I think he is safe to go back to work.  2. Acute on chronic renal insufficiency: Creatinine worsened slightly prior to discharge, will obtain basic metabolic panel to reassess.  3. Hypokalemia: Potassium was low in the hospital, basic metabolic panel  4. Chronic diastolic heart failure: Euvolemic on physical exam  5. Abnormal echocardiogram: Normal ejection fraction, some wall motion abnormality on the previous echocardiogram, however given lack of symptoms, no further ischemic workup was planned.    Medication Adjustments/Labs and Tests Ordered: Current medicines are reviewed at length with the patient today.  Concerns regarding medicines are outlined above.  Medication changes, Labs and Tests ordered today are listed in the Patient Instructions below. There are no Patient Instructions on file for this visit.   Signed, Peter Swaziland, MD  05/20/2017 10:15 AM    [facility]

## 2017-05-24 ENCOUNTER — Ambulatory Visit: Payer: Self-pay | Admitting: Cardiology

## 2017-05-24 DIAGNOSIS — R0989 Other specified symptoms and signs involving the circulatory and respiratory systems: Secondary | ICD-10-CM

## 2017-05-27 ENCOUNTER — Encounter: Payer: Self-pay | Admitting: Cardiology

## 2017-06-21 MED FILL — hydrALAZINE HCL 100 MG TABS: 100 | 90 days supply | Qty: 270 | Fill #0

## 2017-06-21 MED FILL — AMLODIPINE BESYLATE 10 MG T: 10 | 30 days supply | Qty: 30 | Fill #2

## 2017-06-21 MED FILL — FUROSEMIDE 40 MG TAB: 40 | 30 days supply | Qty: 30 | Fill #2

## 2017-06-21 MED FILL — SPIRONOLACTONE 25 MG TABLET: 25 | 30 days supply | Qty: 30 | Fill #2

## 2017-06-21 MED FILL — ISOSORBIDE MN ER 60 MG TAB: 60 | 30 days supply | Qty: 30 | Fill #2

## 2017-06-21 MED FILL — LABETALOL HCL 300 MG TABLET: 300 | 30 days supply | Qty: 60 | Fill #2

## 2017-07-10 ENCOUNTER — Encounter: Payer: Self-pay | Admitting: Cardiology

## 2017-07-29 NOTE — Progress Notes (Signed)
Cardiology Office Note    Date:  07/31/2017   ID:  Alex Liu, DOB 08-May-1965, MRN [id number]  PCP:  Bing Neighbors, FNP  Cardiologist:  Dr. [place]az[place]and  Chief Complaint  Patient presents with  . Follow-up  . Hypertension    History of Present Illness:  Alex Liu is a 52 y.o. male with PMH of HTN, CKD stage III, and history of polysubstance abuse with cocaine, tobacco and EtOH.  He recently was admitted in December 2018 with worsening shortness of breath.  He was on his way home to get his inhaler and apparently lost consciousness.  He was found bystander unconscious lying in the street.  He was given Narcan in the field by EMS with concern of opiate overdose.  On arrival to the ED, he was severely hypertensive with blood pressure of 211/142.  Urine drug test was positive for cocaine, troponin was borderline elevated at 0.08.  BNP was 204.1.  Hemoglobin was normal.  Electrolyte was significant for potassium of 3.4.  He was treated for hypertensive urgency.  He also diagnosed with acute pulmonary edema and treated with IV Lasix.  Echocardiogram obtained on 02/25/2017 showed EF 50-55%, grade 1 diastolic dysfunction, mild AI, mildly dilated left and right atrium.  Ultrasound Doppler showed less than 60% renal artery stenosis bilaterally.  On follow up today he is doing very well. He has made substantial lifestyle changes. He denies any substance use. He is exercising regularly. Trying to eat healthy and avoid salt. He works in [employer]. No edema, SOB, chest pain, dizziness, or palpitations.    Past Medical History:  Diagnosis Date  . Hypertension   . Hypertensive heart disease with acute diastolic congestive heart failure [facility])     Past Surgical History:  Procedure Laterality Date  . BRAIN SURGERY      Current Medications: Outpatient Medications Prior to Visit  Medication Sig Dispense Refill  . amLODipine (NORVASC) 10 MG tablet Take 1 tablet (10 mg total) by mouth  daily. 90 tablet 1  . hydrALAZINE (APRESOLINE) 100 MG tablet Take 1 tablet (100 mg total) by mouth every 8 (eight) hours. 270 tablet 1  . isosorbide mononitrate (IMDUR) 60 MG 24 hr tablet Take 1 tablet (60 mg total) by mouth daily. 90 tablet 1  . labetalol (NORMODYNE) 300 MG tablet Take 1 tablet (300 mg total) by mouth 2 (two) times daily. 180 tablet 1  . Misc. Devices MISC CPAP    . spironolactone (ALDACTONE) 25 MG tablet Take 1 tablet (25 mg total) by mouth daily. 90 tablet 1  . aspirin 325 MG EC tablet Take 1 tablet (325 mg total) by mouth daily. 90 tablet 1  . furosemide (LASIX) 40 MG tablet Take 1 tablet (40 mg total) by mouth daily. 90 tablet 1  . potassium chloride SA (K-DUR,KLOR-CON) 20 MEQ tablet Take 1 tablet (20 mEq total) by mouth daily. 90 tablet 1   No facility-administered medications prior to visit.      Allergies:   Patient has no known allergies.   Social History   Socioeconomic History  . Marital status: Married    Spouse name: Not on file  . Number of children: Not on file  . Years of education: Not on file  . Highest education level: Not on file  Occupational History  . Not on file  Social Needs  . Financial resource strain: Not on file  . Food insecurity:    Worry: Not on file  Inability: Not on file  . Transportation needs:    Medical: Not on file    Non-medical: Not on file  Tobacco Use  . Smoking status: Current Every Day Smoker    Packs/day: 1.00    Types: Cigars  . Smokeless tobacco: Never Used  Substance and Sexual Activity  . Alcohol use: Yes  . Drug use: Yes    Types: Cocaine  . Sexual activity: Not on file  Lifestyle  . Physical activity:    Days per week: Not on file    Minutes per session: Not on file  . Stress: Not on file  Relationships  . Social connections:    Talks on phone: Not on file    Gets together: Not on file    Attends religious service: Not on file    Active member of club or organization: Not on file    Attends  meetings of clubs or organizations: Not on file    Relationship status: Not on file  Other Topics Concern  . Not on file  Social History Narrative  . Not on file     Family History:  The patient's family history includes CAD in his brother; CAD (age of onset: 71) in his brother; Coronary artery disease in his brother; Hypertension in his mother.   ROS:   Please see the history of present illness.    ROS All other systems reviewed and are negative.   PHYSICAL EXAM:   VS:  BP 136/80   Pulse 74   Ht 6' (1.829 m)   Wt 241 lb (109.3 kg)   BMI 32.69 kg/m    GENERAL:  Well appearing WM in NAD HEENT:  PERRL, EOMI, sclera are clear. Oropharynx is clear. NECK:  No jugular venous distention, carotid upstroke brisk and symmetric, no bruits, no thyromegaly or adenopathy LUNGS:  Clear to auscultation bilaterally CHEST:  Unremarkable HEART:  RRR,  PMI not displaced or sustained,S1 and S2 within normal limits, no S3, no S4: no clicks, no rubs, no murmurs ABD:  Soft, nontender. BS +, no masses or bruits. No hepatomegaly, no splenomegaly EXT:  2 + pulses throughout, no edema, no cyanosis no clubbing SKIN:  Warm and dry.  No rashes NEURO:  Alert and oriented x 3. Cranial nerves II through XII intact. PSYCH:  Cognitively intact    Wt Readings from Last 3 Encounters:  07/31/17 241 lb (109.3 kg)  03/18/17 237 lb (107.5 kg)  03/06/17 245 lb (111.1 kg)      Studies/Labs Reviewed:   EKG:  EKG is not ordered today.    Recent Labs: 02/24/2017: Magnesium 2.0 03/06/2017: ALT 25; BNP 40.2; Hemoglobin 12.1; Platelets 361; TSH 2.300 03/18/2017: BUN 25; Creatinine, Ser 1.99; Potassium 4.5; Sodium 142   Lipid Panel    Component Value Date/Time   CHOL 186 02/25/2017 0040   TRIG 76 02/25/2017 0040   HDL 40 (L) 02/25/2017 0040   CHOLHDL 4.7 02/25/2017 0040   VLDL 15 02/25/2017 0040   LDLCALC 131 (H) 02/25/2017 0040    Additional studies/ records that were reviewed today include:   Echo  02/25/2017 LV EF: 50% -   55%  Study Conclusions  - Left ventricle: The cavity size was normal. There was mild   concentric hypertrophy. Systolic function was normal. The   estimated ejection fraction was in the range of 50% to 55%. There   is akinesis of the mid-apicalinferoseptal myocardium. Doppler   parameters are consistent with abnormal left ventricular  relaxation (grade 1 diastolic dysfunction). - Aortic valve: There was mild regurgitation. - Left atrium: The atrium was mildly dilated. - Right atrium: The atrium was mildly dilated. - Tricuspid valve: There was trivial regurgitation.    ASSESSMENT:    1. Chronic diastolic heart failure (HCC)   2. Essential hypertension      PLAN:  In order of problems listed above:  1. Syncope: No recurrence. Probably due to HTN urgency and substance abuse.   2. Chronic kidney disease stage 3: last creatinine 1.99  3. Chronic diastolic heart failure: Euvolemic on physical exam. We will stop lasix now and observe weight and edema. Hopefully with lifestyle changes and good BP control he will not need lasix. Will also stop potassium supplement.  4. Abnormal echocardiogram: Normal ejection fraction, some wall motion abnormality on the previous echocardiogram, however given lack of symptoms, no further ischemic workup was planned.    Medication Adjustments/Labs and Tests Ordered: Current medicines are reviewed at length with the patient today.  Concerns regarding medicines are outlined above.  Medication changes, Labs and Tests ordered today are listed in the Patient Instructions below. Patient Instructions  Stop taking furosemide and potassium  Continue your other medication.  We will arrange follow up in 4 months with lab work      Signed, Peter Swaziland, MD  07/31/2017 10:34 AM    [facility] [address] [place], [place], [place]  [address] Phone: [phone]; Fax: [phone]

## 2017-07-31 ENCOUNTER — Ambulatory Visit (INDEPENDENT_AMBULATORY_CARE_PROVIDER_SITE_OTHER): Payer: Self-pay | Admitting: Cardiology

## 2017-07-31 ENCOUNTER — Encounter: Payer: Self-pay | Admitting: Cardiology

## 2017-07-31 VITALS — BP 136/80 | HR 74 | Ht 72.0 in | Wt 241.0 lb

## 2017-07-31 DIAGNOSIS — I5032 Chronic diastolic (congestive) heart failure: Secondary | ICD-10-CM

## 2017-07-31 DIAGNOSIS — I10 Essential (primary) hypertension: Secondary | ICD-10-CM

## 2017-07-31 MED ORDER — ASPIRIN EC 81 MG PO TBEC: 81.0000 mg | DELAYED_RELEASE_TABLET | Freq: Every day | ORAL | 3 refills | Status: AC

## 2017-07-31 NOTE — Patient Instructions (Signed)
Stop taking furosemide and potassium  Continue your other medication.  We will arrange follow up in 4 months with lab work

## 2017-08-06 MED FILL — AMLODIPINE BESYLATE 10 MG T: 10 | 30 days supply | Qty: 30 | Fill #3

## 2017-08-06 MED FILL — LABETALOL HCL 300 MG TABLET: 300 | 30 days supply | Qty: 60 | Fill #3

## 2017-08-06 MED FILL — ISOSORBIDE MN ER 60 MG TAB: 60 | 30 days supply | Qty: 30 | Fill #3

## 2017-08-06 MED FILL — SPIRONOLACTONE 25 MG TABLET: 25 | 30 days supply | Qty: 30 | Fill #3

## 2017-09-13 MED FILL — ISOSORBIDE MN ER 60 MG TAB: 60 | 30 days supply | Qty: 30 | Fill #4

## 2017-09-13 MED FILL — SPIRONOLACTONE 25 MG TABLET: 25 | 30 days supply | Qty: 30 | Fill #4

## 2017-09-13 MED FILL — LABETALOL HCL 300 MG TABLET: 300 | 30 days supply | Qty: 60 | Fill #4

## 2017-09-13 MED FILL — AMLODIPINE BESYLATE 10 MG T: 10 | 30 days supply | Qty: 30 | Fill #4

## 2017-10-18 MED FILL — SPIRONOLACTONE 25 MG TABLET: 25 | 30 days supply | Qty: 30 | Fill #5

## 2017-10-18 MED FILL — hydrALAZINE HCL 100 MG TABS: 100 | 30 days supply | Qty: 90 | Fill #1

## 2017-10-18 MED FILL — AMLODIPINE BESYLATE 10 MG T: 10 | 30 days supply | Qty: 30 | Fill #5

## 2017-10-18 MED FILL — LABETALOL HCL 300 MG TABLET: 300 | 30 days supply | Qty: 60 | Fill #5

## 2017-10-18 MED FILL — ISOSORBIDE MN ER 60 MG TAB: 60 | 30 days supply | Qty: 30 | Fill #5

## 2017-11-01 ENCOUNTER — Ambulatory Visit: Payer: Self-pay | Admitting: Physician Assistant

## 2017-11-01 DIAGNOSIS — R0989 Other specified symptoms and signs involving the circulatory and respiratory systems: Secondary | ICD-10-CM

## 2017-11-04 ENCOUNTER — Encounter: Payer: Self-pay | Admitting: *Deleted

## 2017-11-27 ENCOUNTER — Other Ambulatory Visit: Payer: Self-pay

## 2017-11-28 ENCOUNTER — Encounter (HOSPITAL_COMMUNITY): Payer: Self-pay

## 2017-11-28 ENCOUNTER — Ambulatory Visit (INDEPENDENT_AMBULATORY_CARE_PROVIDER_SITE_OTHER): Payer: Self-pay

## 2017-11-28 ENCOUNTER — Ambulatory Visit (HOSPITAL_COMMUNITY)
Admission: EM | Admit: 2017-11-28 | Discharge: 2017-11-28 | Disposition: A | Discharge status: Home / Self Care | Payer: Self-pay | Attending: Family Medicine | Admitting: Family Medicine

## 2017-11-28 ENCOUNTER — Other Ambulatory Visit: Payer: Self-pay

## 2017-11-28 DIAGNOSIS — J209 Acute bronchitis, unspecified: Secondary | ICD-10-CM

## 2017-11-28 MED ORDER — AZITHROMYCIN 250 MG PO TABS: ORAL_TABLET | ORAL | 0 refills | Status: AC

## 2017-11-28 MED ORDER — IPRATROPIUM-ALBUTEROL 0.5-2.5 (3) MG/3ML IN SOLN
3.0000 mL | Freq: Once | RESPIRATORY_TRACT | Status: AC
  Administered 2017-11-28: 3 mL via RESPIRATORY_TRACT

## 2017-11-28 MED ORDER — ALBUTEROL SULFATE HFA 108 (90 BASE) MCG/ACT IN AERS
INHALATION_SPRAY | RESPIRATORY_TRACT | Status: AC
  Filled 2017-11-28: qty 6.7

## 2017-11-28 MED ORDER — IPRATROPIUM-ALBUTEROL 0.5-2.5 (3) MG/3ML IN SOLN
RESPIRATORY_TRACT | Status: AC
Start: 2017-11-28 — End: ?
  Filled 2017-11-28: qty 3

## 2017-11-28 MED ORDER — PREDNISONE 20 MG PO TABS: 40.0000 mg | ORAL_TABLET | Freq: Every day | ORAL | 0 refills | Status: AC

## 2017-11-28 MED ORDER — ALBUTEROL SULFATE HFA 108 (90 BASE) MCG/ACT IN AERS
2.0000 | INHALATION_SPRAY | Freq: Once | RESPIRATORY_TRACT | Status: AC
  Administered 2017-11-28: 2 via RESPIRATORY_TRACT

## 2017-11-28 NOTE — ED Provider Notes (Signed)
[facility]    CSN: [id number] Arrival date & time: 11/28/17  1815     History   Chief Complaint Chief Complaint  Patient presents with  . Shortness of Breath    HPI Alex Liu is a 52 y.o. male.   Alex Liu presents with complaints of cough, congestion and shortness of breath  Which started 9/14. Today breathing felt worse. Works outside. No known fevers. Shortness of breath  Worse with activity. No chest pain . No fevers. No known ill contacts. No rash. No sore throat or ear pain. No gi/gu complaints. Denies allergy history. No asthma history, states he doesn't smoke. No ear pain or sore throat. No leg swelling. Cough is occasionally productive of sputum. Hx of htn, acute pulmonary edema and chf.     ROS per HPI.      Past Medical History:  Diagnosis Date  . Hypertension   . Hypertensive heart disease with acute diastolic congestive heart failure [facility])     Patient Active Problem List   Diagnosis Date Noted  . Elevated serum creatinine   . Acute pulmonary edema (HCC) 02/24/2017  . Hypertensive heart disease with acute diastolic congestive heart failure [facility])     Past Surgical History:  Procedure Laterality Date  . BRAIN SURGERY         Home Medications    Prior to Admission medications   Medication Sig Start Date End Date Taking? Authorizing Provider  amLODipine (NORVASC) 10 MG tablet Take 1 tablet (10 mg total) by mouth daily. 03/06/17   Bing Neighbors, FNP  aspirin EC 81 MG tablet Take 1 tablet (81 mg total) by mouth daily. 07/31/17   Sw[place], Peter M, MD  azithromycin (ZITHROMAX) 250 MG tablet Take 2 tablets (500 mg total) by mouth daily for 1 day, THEN 1 tablet (250 mg total) daily for 4 days. 11/28/17 12/03/17  Georgetta Haber, NP  hydrALAZINE (APRESOLINE) 100 MG tablet Take 1 tablet (100 mg total) by mouth every 8 (eight) hours. 05/14/17   Bing Neighbors, FNP  isosorbide mononitrate (IMDUR) 60 MG 24 hr tablet Take 1 tablet (60 mg total)  by mouth daily. 03/06/17   Bing Neighbors, FNP  labetalol (NORMODYNE) 300 MG tablet Take 1 tablet (300 mg total) by mouth 2 (two) times daily. 03/06/17   Bing Neighbors, FNP  Misc. Devices MISC CPAP    [provider]  predniSONE (DELTASONE) 20 MG tablet Take 2 tablets (40 mg total) by mouth daily with breakfast for 5 days. 11/28/17 12/03/17  Georgetta Haber, NP  spironolactone (ALDACTONE) 25 MG tablet Take 1 tablet (25 mg total) by mouth daily. 03/06/17   Bing Neighbors, FNP    Family History Family History  Problem Relation Age of Onset  . Hypertension Mother   . CAD Brother 23  . Coronary artery disease Brother   . CAD Brother     Social History Social History   Tobacco Use  . Smoking status: Current Every Day Smoker    Packs/day: 1.00    Types: Cigars  . Smokeless tobacco: Never Used  Substance Use Topics  . Alcohol use: Yes  . Drug use: Yes    Types: Cocaine     Allergies   Patient has no known allergies.   Review of Systems Review of Systems   Physical Exam Triage Vital Signs ED Triage Vitals  Enc Vitals Group     BP 11/28/17 1846 (!) 166/89  Pulse Rate 11/28/17 1846 74     Resp 11/28/17 1846 16     Temp 11/28/17 1846 98.8 F (37.1 C)     Temp Source 11/28/17 1846 Oral     SpO2 11/28/17 1846 98 %     Weight 11/28/17 1847 244 lb (110.7 kg)     Height --      Head Circumference --      Peak Flow --      Pain Score --      Pain Loc --      Pain Edu? --      Excl. in GC? --    No data found.  Updated Vital Signs BP (!) 166/89 (BP Location: Right Arm)   Pulse 74   Temp 98.8 F (37.1 C) (Oral)   Resp 16   Wt 244 lb (110.7 kg)   SpO2 98%   BMI 33.09 kg/m    Physical Exam  Constitutional: He is oriented to person, place, and time. He appears well-developed and well-nourished.  HENT:  Head: Normocephalic and atraumatic.  Right Ear: Tympanic membrane, external ear and ear canal normal.  Left Ear: Tympanic membrane,  external ear and ear canal normal.  Nose: Nose normal. Right sinus exhibits no maxillary sinus tenderness and no frontal sinus tenderness. Left sinus exhibits no maxillary sinus tenderness and no frontal sinus tenderness.  Mouth/Throat: Uvula is midline, oropharynx is clear and moist and mucous membranes are normal.  Eyes: Pupils are equal, round, and reactive to light. Conjunctivae are normal.  Neck: Normal range of motion.  Cardiovascular: Normal rate and regular rhythm.  Pulmonary/Chest: Effort normal. He has wheezes.  Musculoskeletal:  No lower extremity edema noted   Lymphadenopathy:    He has no cervical adenopathy.  Neurological: He is alert and oriented to person, place, and time.  Skin: Skin is warm and dry.  Vitals reviewed.    UC Treatments / Results  Labs (all labs ordered are listed, but only abnormal results are displayed) Labs Reviewed - No data to display  EKG None  Radiology Dg Chest 2 View  Result Date: 11/28/2017 CLINICAL DATA:  Wheezing and shortness of breath EXAM: CHEST - 2 VIEW COMPARISON:  02/24/2017 FINDINGS: The heart size and mediastinal contours are within normal limits. Both lungs are clear. The visualized skeletal structures are unremarkable. IMPRESSION: Clear lungs. Electronically Signed   By: Deatra RobinsonKevin  Herman M.D.   On: 11/28/2017 19:38    Procedures Procedures (including critical care time)  Medications Ordered in UC Medications  albuterol (PROVENTIL HFA;VENTOLIN HFA) 108 (90 Base) MCG/ACT inhaler 2 puff (has no administration in time range)  ipratropium-albuterol (DUONEB) 0.5-2.5 (3) MG/3ML nebulizer solution 3 mL (3 mLs Nebulization Given 11/28/17 1947)    Initial Impression / Assessment and Plan / UC Course  I have reviewed the triage vital signs and the nursing notes.  Pertinent labs & imaging results that were available during my care of the patient were reviewed by me and considered in my medical decision making (see chart for  details).     Wheezing throughout on initial exam with cough, congestion and shortness of breath . Chest xray reassuring here today. Neb provided in clinic. Improved lung sounds on auscultation. Course of azithromycin, prednisone provided. Inhaler provided. Return precautions provided. If symptoms worsen or do not improve in the next week to return to be seen or to follow up with PCP.  Patient verbalized understanding and agreeable to plan.    Final Clinical Impressions(s) /  UC Diagnoses   Final diagnoses:  Acute bronchitis, unspecified organism   Discharge Instructions   None    ED Prescriptions    Medication Sig Dispense Auth. Provider   azithromycin (ZITHROMAX) 250 MG tablet Take 2 tablets (500 mg total) by mouth daily for 1 day, THEN 1 tablet (250 mg total) daily for 4 days. 6 tablet Linus Mako B, NP   predniSONE (DELTASONE) 20 MG tablet Take 2 tablets (40 mg total) by mouth daily with breakfast for 5 days. 10 tablet Georgetta Haber, NP     Controlled Substance Prescriptions [place] Controlled Substance Registry consulted? Not Applicable   Georgetta Haber, NP 11/28/17 1958

## 2017-11-28 NOTE — Discharge Instructions (Addendum)
Push fluids to ensure adequate hydration and keep secretions thin.  Tylenol and/or ibuprofen as needed for pain or fevers.  Inhaler every 4-6 hours as needed for wheezing or shortness of breath.  Complete course of antibiotics.  5 days of prednisone.  If symptoms worsen or do not improve in the next week to return to be seen or to follow up with your PCP.

## 2017-11-28 NOTE — ED Triage Notes (Signed)
Pt states he has SOB x 3 days. And a common cold.

## 2017-12-02 ENCOUNTER — Telehealth: Payer: Self-pay | Admitting: Cardiology

## 2017-12-02 MED ORDER — ISOSORBIDE MONONITRATE ER 60 MG PO TB24: 60.0000 mg | ORAL_TABLET | Freq: Every day | ORAL | 1 refills | Status: DC

## 2017-12-02 MED ORDER — SPIRONOLACTONE 25 MG PO TABS: 25.0000 mg | ORAL_TABLET | Freq: Every day | ORAL | 1 refills | Status: DC

## 2017-12-02 MED ORDER — AMLODIPINE BESYLATE 10 MG PO TABS: 10.0000 mg | ORAL_TABLET | Freq: Every day | ORAL | 1 refills | Status: DC

## 2017-12-02 MED ORDER — LABETALOL HCL 300 MG PO TABS: 300.0000 mg | ORAL_TABLET | Freq: Two times a day (BID) | ORAL | 1 refills | Status: DC

## 2017-12-02 MED ORDER — HYDRALAZINE HCL 100 MG PO TABS
100.0000 mg | ORAL_TABLET | Freq: Three times a day (TID) | ORAL | 1 refills | Status: DC
Start: 2017-12-02 — End: 2017-12-04

## 2017-12-02 NOTE — Telephone Encounter (Signed)
° ° °*  STAT* If patient is at the pharmacy, call can be transferred to refill team.   1. Which medications need to be refilled? (please list name of each medication and dose if known) hydrALAZINE (APRESOLINE) 100 MG tablet, isosorbide mononitrate (IMDUR) 60 MG 24 hr tablet, labetalol (NORMODYNE) 300 MG tablet, amLODipine (NORVASC) 10 MG tablet, spironolactone (ALDACTONE) 25 MG tablet  2. Which pharmacy/location (including street and city if local pharmacy) is medication to be sent to? [facility] [place]  3. Do they need a 30 day or 90 day supply? 30

## 2017-12-04 ENCOUNTER — Telehealth: Payer: Self-pay

## 2017-12-04 MED ORDER — AMLODIPINE BESYLATE 10 MG PO TABS: 10.0000 mg | ORAL_TABLET | Freq: Every day | ORAL | 1 refills | Status: DC

## 2017-12-04 MED ORDER — LABETALOL HCL 300 MG PO TABS: 300.0000 mg | ORAL_TABLET | Freq: Two times a day (BID) | ORAL | 1 refills | Status: DC

## 2017-12-04 MED ORDER — HYDRALAZINE HCL 100 MG PO TABS: 100.0000 mg | ORAL_TABLET | Freq: Three times a day (TID) | ORAL | 1 refills | Status: DC

## 2017-12-04 MED ORDER — SPIRONOLACTONE 25 MG PO TABS: 25.0000 mg | ORAL_TABLET | Freq: Every day | ORAL | 1 refills | Status: DC

## 2017-12-04 MED ORDER — ISOSORBIDE MONONITRATE ER 60 MG PO TB24: 60.0000 mg | ORAL_TABLET | Freq: Every day | ORAL | 1 refills | Status: DC

## 2017-12-04 MED FILL — hydrALAZINE HCL 100 MG TABS: 100 | 30 days supply | Qty: 90 | Fill #0

## 2017-12-04 MED FILL — ISOSORBIDE MN ER 60 MG TAB: 60 | 30 days supply | Qty: 30 | Fill #0

## 2017-12-04 MED FILL — AMLODIPINE BESYLATE 10 MG T: 10 | 30 days supply | Qty: 30 | Fill #0

## 2017-12-04 MED FILL — LABETALOL HCL 300 MG TABLET: 300 | 30 days supply | Qty: 60 | Fill #0

## 2017-12-04 MED FILL — SPIRONOLACTONE 25 MG TABLET: 25 | 30 days supply | Qty: 30 | Fill #0

## 2017-12-04 NOTE — Telephone Encounter (Signed)
Spoke with pt wife. Pt refills were sent to [facility] in error. Pt needs refills sent to [facility] for all of the pt's cardiac meds listed. (Done)

## 2017-12-14 LAB — BASIC METABOLIC PANEL
BUN / CREAT RATIO: 11 (ref 9–20)
BUN: 13 mg/dL (ref 6–24)
CALCIUM: 9.3 mg/dL (ref 8.7–10.2)
CO2: 22 mmol/L (ref 20–29)
Chloride: 103 mmol/L (ref 96–106)
Creatinine, Ser: 1.17 mg/dL (ref 0.76–1.27)
GFR, EST AFRICAN AMERICAN: 84 mL/min/{1.73_m2} (ref >59)
GFR, EST NON AFRICAN AMERICAN: 72 mL/min/{1.73_m2} (ref >59)
Glucose: 89 mg/dL (ref 65–99)
Potassium: 4.2 mmol/L (ref 3.5–5.2)
Sodium: 141 mmol/L (ref 134–144)

## 2017-12-14 LAB — BRAIN NATRIURETIC PEPTIDE: BNP: 14.5 pg/mL (ref 0.0–100.0)

## 2017-12-20 ENCOUNTER — Encounter: Payer: Self-pay | Admitting: Physician Assistant

## 2017-12-20 ENCOUNTER — Ambulatory Visit (INDEPENDENT_AMBULATORY_CARE_PROVIDER_SITE_OTHER): Payer: Self-pay | Admitting: Physician Assistant

## 2017-12-20 VITALS — BP 128/82 | HR 73 | Ht 72.0 in | Wt 228.0 lb

## 2017-12-20 DIAGNOSIS — E785 Hyperlipidemia, unspecified: Secondary | ICD-10-CM

## 2017-12-20 DIAGNOSIS — I10 Essential (primary) hypertension: Secondary | ICD-10-CM

## 2017-12-20 DIAGNOSIS — R931 Abnormal findings on diagnostic imaging of heart and coronary circulation: Secondary | ICD-10-CM

## 2017-12-20 DIAGNOSIS — Z720 Tobacco use: Secondary | ICD-10-CM

## 2017-12-20 MED ORDER — NICOTINE 21 MG/24HR TD PT24: 21.0000 mg | MEDICATED_PATCH | Freq: Every day | TRANSDERMAL | 0 refills | Status: AC

## 2017-12-20 MED ORDER — AMLODIPINE BESYLATE 10 MG PO TABS: 10.0000 mg | ORAL_TABLET | Freq: Every day | ORAL | 3 refills | Status: DC

## 2017-12-20 MED ORDER — NICOTINE 7 MG/24HR TD PT24: 7.0000 mg | MEDICATED_PATCH | Freq: Every day | TRANSDERMAL | 0 refills | Status: DC

## 2017-12-20 MED ORDER — NICOTINE 14 MG/24HR TD PT24: 14.0000 mg | MEDICATED_PATCH | Freq: Every day | TRANSDERMAL | 0 refills | Status: DC

## 2017-12-20 MED ORDER — NICOTINE 14 MG/24HR TD PT24: 14.0000 mg | MEDICATED_PATCH | Freq: Every day | TRANSDERMAL | 0 refills | Status: AC

## 2017-12-20 MED ORDER — NICOTINE 7 MG/24HR TD PT24: 7.0000 mg | MEDICATED_PATCH | Freq: Every day | TRANSDERMAL | 0 refills | Status: AC

## 2017-12-20 MED ORDER — SPIRONOLACTONE 25 MG PO TABS: 25.0000 mg | ORAL_TABLET | Freq: Every day | ORAL | 3 refills | Status: DC

## 2017-12-20 MED ORDER — LABETALOL HCL 300 MG PO TABS: 300.0000 mg | ORAL_TABLET | Freq: Two times a day (BID) | ORAL | 3 refills | Status: DC

## 2017-12-20 MED ORDER — ISOSORBIDE MONONITRATE ER 60 MG PO TB24: 60.0000 mg | ORAL_TABLET | Freq: Every day | ORAL | 3 refills | Status: DC

## 2017-12-20 MED ORDER — HYDRALAZINE HCL 100 MG PO TABS: 100.0000 mg | ORAL_TABLET | Freq: Three times a day (TID) | ORAL | 3 refills | Status: DC

## 2017-12-20 NOTE — Patient Instructions (Signed)
Medication Instructions:  Your physician recommends that you continue on your current medications as directed. Please refer to the Current Medication list given to you today. REFILLS HAVE BEEN SENT TO PHARMACY; HARD COPY OF RXs FOR NICOTINE PATCHES GIVEN TO PATIENT If you need a refill on your cardiac medications before your next appointment, please call your pharmacy.   Lab work: Your physician recommends that you return for lab work in: WITH IN THE NEXT MONTH FASTING -LIPID If you have labs (blood work) drawn today and your tests are completely normal, you will receive your results only by: Marland Kitchen MyChart Message (if you have MyChart) OR . A paper copy in the mail If you have any lab test that is abnormal or we need to change your treatment, we will call you to review the results.  Testing/Procedures: NONE   Follow-Up: At [facility], you and your health needs are our priority.  As part of our continuing mission to provide you with exceptional heart care, we have created designated Provider Care Teams.  These Care Teams include your primary Cardiologist (physician) and Advanced Practice Providers (APPs -  Physician Assistants and Nurse Practitioners) who all work together to provide you with the care you need, when you need it. You will need a follow up appointment in 6-8 months.  Please call our office 2 months in advance to schedule this appointment.  You may see Peter Swaziland, MD or one of the following Advanced Practice Providers on your designated Care Team: [place], [place] . Micah Flesher, PA-C  Any Other Special Instructions Will Be Listed Below (If Applicable)

## 2017-12-20 NOTE — Progress Notes (Signed)
Cardiology Office Note    Date:  12/20/2017   ID:  Alex Liu, DOB 03-31-1965, MRN [id number]  PCP:  Bing Neighbors, FNP  Cardiologist:  Dr. Swaziland  Chief Complaint  Patient presents with  . Follow-up    seen for Dr. Swaziland.     History of Present Illness:  Alex Liu is a 52 y.o. male with PMH of HTN and history of polysubstance abuse with cocaine, tobacco and EtOH.  He recently was admitted in December 2018 with worsening shortness of breath.  He was on his way home to get his inhaler and apparently lost consciousness.  He was found by bystander unconscious lying in the street.  He was given Narcan in the field by EMS with concern of opiate overdose.  On arrival to the ED, he was severely hypertensive with blood pressure of 211/142.  Urine drug test was positive for cocaine, troponin was borderline elevated at 0.08.  BNP was 204.1.  Hemoglobin was normal.  Electrolyte was significant for potassium of 3.4.  He was treated for hypertensive urgency, he is not a candidate for beta-blocker due to cocaine use.  He also diagnosed with acute pulmonary edema and treated with IV Lasix.  Echocardiogram obtained on 02/25/2017 showed EF 50-55%, grade 1 diastolic dysfunction, mild AI, mildly dilated left and right atrium.  Ultrasound Doppler showed less than 60% renal artery stenosis bilaterally.  I last saw the patient in January 2019, he was doing well at the time.  He was seen by Dr. Swaziland in May 2019, his Lasix was stopped.  Patient presents today for cardiology office visit.  He denies any recent chest pain or shortness of breath.  He does not have any lower extremity edema, orthopnea or PND.  He wished to stop smoking at this point, we discussed the potential possibilities between nicotine patches, Wellbutrin and Chantix.  He wished to try nicotine patches first.  Since he previously only smokes cigar which has very high nicotine content, I recommend to start with 21 mg then go down to  14 mg then go down to 7 mg then stop.  Otherwise he is doing well from cardiology perspective he can follow-up with Dr. Swaziland in 6 to 8 month.  In the next month, he will need a repeat fasting lipid panel, if cholesterol does remain high we will need to start on statin therapy.   Past Medical History:  Diagnosis Date  . Hypertension   . Hypertensive heart disease with acute diastolic congestive heart failure [facility])     Past Surgical History:  Procedure Laterality Date  . BRAIN SURGERY      Current Medications: Outpatient Medications Prior to Visit  Medication Sig Dispense Refill  . aspirin EC 81 MG tablet Take 1 tablet (81 mg total) by mouth daily. 90 tablet 3  . Misc. Devices MISC CPAP    . amLODipine (NORVASC) 10 MG tablet Take 1 tablet (10 mg total) by mouth daily. 90 tablet 1  . hydrALAZINE (APRESOLINE) 100 MG tablet Take 1 tablet (100 mg total) by mouth every 8 (eight) hours. 270 tablet 1  . isosorbide mononitrate (IMDUR) 60 MG 24 hr tablet Take 1 tablet (60 mg total) by mouth daily. 90 tablet 1  . labetalol (NORMODYNE) 300 MG tablet Take 1 tablet (300 mg total) by mouth 2 (two) times daily. 180 tablet 1  . spironolactone (ALDACTONE) 25 MG tablet Take 1 tablet (25 mg total) by mouth daily. 90 tablet 1  No facility-administered medications prior to visit.      Allergies:   Patient has no known allergies.   Social History   Socioeconomic History  . Marital status: Married    Spouse name: Not on file  . Number of children: Not on file  . Years of education: Not on file  . Highest education level: Not on file  Occupational History  . Not on file  Social Needs  . Financial resource strain: Not on file  . Food insecurity:    Worry: Not on file    Inability: Not on file  . Transportation needs:    Medical: Not on file    Non-medical: Not on file  Tobacco Use  . Smoking status: Current Every Day Smoker    Packs/day: 1.00    Types: Cigars  . Smokeless tobacco: Never  Used  Substance and Sexual Activity  . Alcohol use: Yes  . Drug use: Yes    Types: Cocaine  . Sexual activity: Not on file  Lifestyle  . Physical activity:    Days per week: Not on file    Minutes per session: Not on file  . Stress: Not on file  Relationships  . Social connections:    Talks on phone: Not on file    Gets together: Not on file    Attends religious service: Not on file    Active member of club or organization: Not on file    Attends meetings of clubs or organizations: Not on file    Relationship status: Not on file  Other Topics Concern  . Not on file  Social History Narrative  . Not on file     Family History:  The patient's family history includes CAD in his brother; CAD (age of onset: 88) in his brother; Coronary artery disease in his brother; Hypertension in his mother.   ROS:   Please see the history of present illness.    ROS All other systems reviewed and are negative.   PHYSICAL EXAM:   VS:  BP 128/82   Pulse 73   Ht 6' (1.829 m)   Wt 228 lb (103.4 kg)   BMI 30.92 kg/m    GEN: Well nourished, well developed, in no acute distress  HEENT: normal  Neck: no JVD, carotid bruits, or masses Cardiac: RRR; no murmurs, rubs, or gallops,no edema  Respiratory:  clear to auscultation bilaterally, normal work of breathing GI: soft, nontender, nondistended, + BS MS: no deformity or atrophy  Skin: warm and dry, no rash Neuro:  Alert and Oriented x 3, Strength and sensation are intact Psych: euthymic mood, full affect  Wt Readings from Last 3 Encounters:  12/20/17 228 lb (103.4 kg)  11/28/17 244 lb (110.7 kg)  07/31/17 241 lb (109.3 kg)      Studies/Labs Reviewed:   EKG:  EKG is ordered today.  The ekg ordered today demonstrates normal sinus rhythm, heart rate 62.  No significant ST-T wave changes  Recent Labs: 02/24/2017: Magnesium 2.0 03/06/2017: ALT 25; Hemoglobin 12.1; Platelets 361; TSH 2.300 12/13/2017: BNP 14.5; BUN 13; Creatinine, Ser 1.17;  Potassium 4.2; Sodium 141   Lipid Panel    Component Value Date/Time   CHOL 186 02/25/2017 0040   TRIG 76 02/25/2017 0040   HDL 40 (L) 02/25/2017 0040   CHOLHDL 4.7 02/25/2017 0040   VLDL 15 02/25/2017 0040   LDLCALC 131 (H) 02/25/2017 0040    Additional studies/ records that were reviewed today include:   Echo  02/25/2017 LV EF: 50% -   55% Study Conclusions  - Left ventricle: The cavity size was normal. There was mild   concentric hypertrophy. Systolic function was normal. The   estimated ejection fraction was in the range of 50% to 55%. There   is akinesis of the mid-apicalinferoseptal myocardium. Doppler   parameters are consistent with abnormal left ventricular   relaxation (grade 1 diastolic dysfunction). - Aortic valve: There was mild regurgitation. - Left atrium: The atrium was mildly dilated. - Right atrium: The atrium was mildly dilated. - Tricuspid valve: There was trivial regurgitation.     ASSESSMENT:    1. Essential hypertension   2. Hyperlipidemia, unspecified hyperlipidemia type   3. Abnormal echocardiogram   4. Tobacco abuse      PLAN:  In order of problems listed above:  1. Hypertension: Blood pressure stable on current medication.  Will refill blood pressure medication.  Recent lab work reviewed, shows normal renal function and electrolyte.  2. Hyperlipidemia: Patient will need repeat fasting lipid panel in the next month.  Last lipid panel obtained 10/29/2016 showed uncontrolled LDL  3. Abnormal echocardiogram: Previous echocardiogram showed wall motion abnormality, did not have stress test given lack of symptom.  Continue to monitor.  EKG today showed normal sinus rhythm, no ischemic changes  4. Tobacco abuse: Patient expressed wish to quit smoking, we discussed nicotine patches, Wellbutrin versus Chantix.  He was to start with nicotine patch for now.    Medication Adjustments/Labs and Tests Ordered: Current medicines are reviewed at length  with the patient today.  Concerns regarding medicines are outlined above.  Medication changes, Labs and Tests ordered today are listed in the Patient Instructions below. Patient Instructions  Medication Instructions:  Your physician recommends that you continue on your current medications as directed. Please refer to the Current Medication list given to you today. REFILLS HAVE BEEN SENT TO PHARMACY; HARD COPY OF RXs FOR NICOTINE PATCHES GIVEN TO PATIENT If you need a refill on your cardiac medications before your next appointment, please call your pharmacy.   Lab work: Your physician recommends that you return for lab work in: WITH IN THE NEXT MONTH FASTING -LIPID If you have labs (blood work) drawn today and your tests are completely normal, you will receive your results only by: Marland Kitchen MyChart Message (if you have MyChart) OR . A paper copy in the mail If you have any lab test that is abnormal or we need to change your treatment, we will call you to review the results.  Testing/Procedures: NONE   Follow-Up: At [facility], you and your health needs are our priority.  As part of our continuing mission to provide you with exceptional heart care, we have created designated Provider Care Teams.  These Care Teams include your primary Cardiologist (physician) and Advanced Practice Providers (APPs -  Physician Assistants and Nurse Practitioners) who all work together to provide you with the care you need, when you need it. You will need a follow up appointment in 6-8 months.  Please call our office 2 months in advance to schedule this appointment.  You may see Peter Swaziland, MD or one of the following Advanced Practice Providers on your designated Care Team: [place], [place] . Micah Flesher, PA-C  Any Other Special Instructions Will Be Listed Below (If Applicable)      Signed, Azalee Course, PA  12/20/2017 4:19 PM    [facility] [address] [place], [place], [place]  [address] Phone:  [phone]; Fax: [phone]

## 2018-01-06 MED FILL — LABETALOL HCL 300 MG TABLET: 300 | 30 days supply | Qty: 60 | Fill #1

## 2018-01-06 MED FILL — ISOSORBIDE MN ER 60 MG TAB: 60 | 30 days supply | Qty: 30 | Fill #1

## 2018-01-06 MED FILL — AMLODIPINE BESYLATE 10 MG T: 10 | 30 days supply | Qty: 30 | Fill #1

## 2018-01-06 MED FILL — hydrALAZINE HCL 100 MG TABS: 100 | 30 days supply | Qty: 90 | Fill #1

## 2018-01-06 MED FILL — SPIRONOLACTONE 25 MG TABLET: 25 | 30 days supply | Qty: 30 | Fill #1

## 2018-02-10 MED FILL — SPIRONOLACTONE 25 MG TABLET: 25 | 30 days supply | Qty: 30 | Fill #2

## 2018-02-10 MED FILL — AMLODIPINE BESYLATE 10 MG T: 10 | 30 days supply | Qty: 30 | Fill #2

## 2018-02-10 MED FILL — ISOSORBIDE MN ER 60 MG TAB: 60 | 30 days supply | Qty: 30 | Fill #2

## 2018-02-12 MED FILL — hydrALAZINE HCL 100 MG TABS: 100 | 30 days supply | Qty: 90 | Fill #2

## 2018-02-12 MED FILL — LABETALOL HCL 300 MG TABLET: 300 | 30 days supply | Qty: 60 | Fill #2

## 2018-03-25 MED FILL — SPIRONOLACTONE 25 MG TABLET: 25 | 30 days supply | Qty: 30 | Fill #3

## 2018-03-25 MED FILL — AMLODIPINE BESYLATE 10 MG T: 10 | 30 days supply | Qty: 30 | Fill #3

## 2018-03-25 MED FILL — ISOSORBIDE MN ER 60 MG TAB: 60 | 30 days supply | Qty: 30 | Fill #3

## 2018-03-31 MED FILL — hydrALAZINE HCL 100 MG TABS: 100 | 30 days supply | Qty: 90 | Fill #3

## 2018-03-31 MED FILL — LABETALOL HCL 300 MG TABLET: 300 | 30 days supply | Qty: 60 | Fill #3

## 2018-04-23 MED FILL — ISOSORBIDE MN ER 60 MG TAB: 60 | 30 days supply | Qty: 30 | Fill #4

## 2018-04-23 MED FILL — SPIRONOLACTONE 25 MG TABLET: 25 | 30 days supply | Qty: 30 | Fill #4

## 2018-04-23 MED FILL — AMLODIPINE BESYLATE 10 MG T: 10 | 30 days supply | Qty: 30 | Fill #4

## 2018-05-06 MED FILL — LABETALOL HCL 300 MG TABS: 300 | 30 days supply | Qty: 60 | Fill #4

## 2018-05-27 MED FILL — AMLODIPINE BESYLATE 10 MG T: 10 | 30 days supply | Qty: 30 | Fill #5

## 2018-05-27 MED FILL — ISOSORBIDE MN ER 60 MG TAB: 60 | 30 days supply | Qty: 30 | Fill #5

## 2018-05-27 MED FILL — SPIRONOLACTONE 25 MG TABLET: 25 | 30 days supply | Qty: 30 | Fill #5

## 2018-06-04 MED FILL — hydrALAZINE HCL 100 MG TABS: 100 | 30 days supply | Qty: 90 | Fill #4

## 2018-07-17 ENCOUNTER — Other Ambulatory Visit: Payer: Self-pay | Admitting: Cardiology

## 2018-07-17 MED FILL — ?SPIRONOLACTON 25MG TABLET: 25 | 30 days supply | Qty: 30 | Fill #0

## 2018-07-17 MED FILL — ISOSORBIDE MN ER 60 MG TAB: 60 | 30 days supply | Qty: 30 | Fill #0

## 2018-07-17 MED FILL — ?AMLODIPINE BESYLATE 10 MG: 10 | 90 days supply | Qty: 90 | Fill #0

## 2018-07-17 MED FILL — LABETALOL HCL 300 MG TABS: 300 | 30 days supply | Qty: 60 | Fill #5

## 2018-07-17 NOTE — Telephone Encounter (Signed)
Aldactone 25 mg refilled. 

## 2018-08-05 MED FILL — hydrALAZINE HCL 100 MG TABS: 100 | 30 days supply | Qty: 90 | Fill #5

## 2018-08-27 MED FILL — ?SPIRONOLACTON 25MG TABLET: 25 | 30 days supply | Qty: 30 | Fill #1

## 2018-08-27 MED FILL — ISOSORBIDE MN ER 60 MG TAB: 60 | 30 days supply | Qty: 30 | Fill #1

## 2018-09-08 MED FILL — LABETALOL HCL 300 MG TABS: 300 | 30 days supply | Qty: 60 | Fill #0

## 2018-09-22 MED FILL — hydrALAZINE HCL 100 MG TABS: 100 | 30 days supply | Qty: 90 | Fill #0

## 2018-10-07 MED FILL — ISOSORBIDE MN ER 60 MG TAB: 60 | 30 days supply | Qty: 30 | Fill #2

## 2018-10-07 MED FILL — ?SPIRONOLACTON 25MG TABLET: 25 | 30 days supply | Qty: 30 | Fill #2

## 2018-10-17 MED FILL — LABETALOL HCL 300 MG TABS: 300 | 30 days supply | Qty: 60 | Fill #1

## 2018-11-05 MED FILL — hydrALAZINE HCL 100 MG TABS: 100 | 30 days supply | Qty: 90 | Fill #1

## 2018-11-12 MED FILL — ISOSORBIDE MN ER 60 MG TAB: 60 | 30 days supply | Qty: 30 | Fill #3

## 2018-11-12 MED FILL — ?SPIRONOLACTONE 25 MG TABLE: 25 | 30 days supply | Qty: 30 | Fill #3

## 2018-11-12 MED FILL — ?AMLODIPINE BESYLATE 10 MG: 10 | 90 days supply | Qty: 90 | Fill #1

## 2018-12-05 MED FILL — ISOSORBIDE MN ER 60 MG TAB: 60 | 30 days supply | Qty: 30 | Fill #3

## 2018-12-22 ENCOUNTER — Other Ambulatory Visit: Payer: Self-pay | Admitting: Physician Assistant

## 2018-12-22 ENCOUNTER — Other Ambulatory Visit: Payer: Self-pay | Admitting: Cardiology

## 2018-12-22 MED FILL — ?SPIRONOLACTONE 25 MG TABLE: 25 | 30 days supply | Qty: 30 | Fill #4

## 2018-12-22 MED FILL — LABETALOL HCL 300 MG TABS: 300 | 30 days supply | Qty: 60 | Fill #0

## 2018-12-22 MED FILL — hydrALAZINE HCL 100 MG TABS: 100 | 30 days supply | Qty: 90 | Fill #0

## 2018-12-22 MED FILL — ISOSORBIDE MN ER 60 MG TAB: 60 | 30 days supply | Qty: 30 | Fill #3

## 2018-12-26 IMAGING — DX DG CHEST 1V PORT
1 series · 1 of 1 positions shown · non-contrast
Comparison: 10/29/2014

CLINICAL DATA: Respiratory distress

EXAM:
PORTABLE CHEST 1 VIEW

[chest ap]
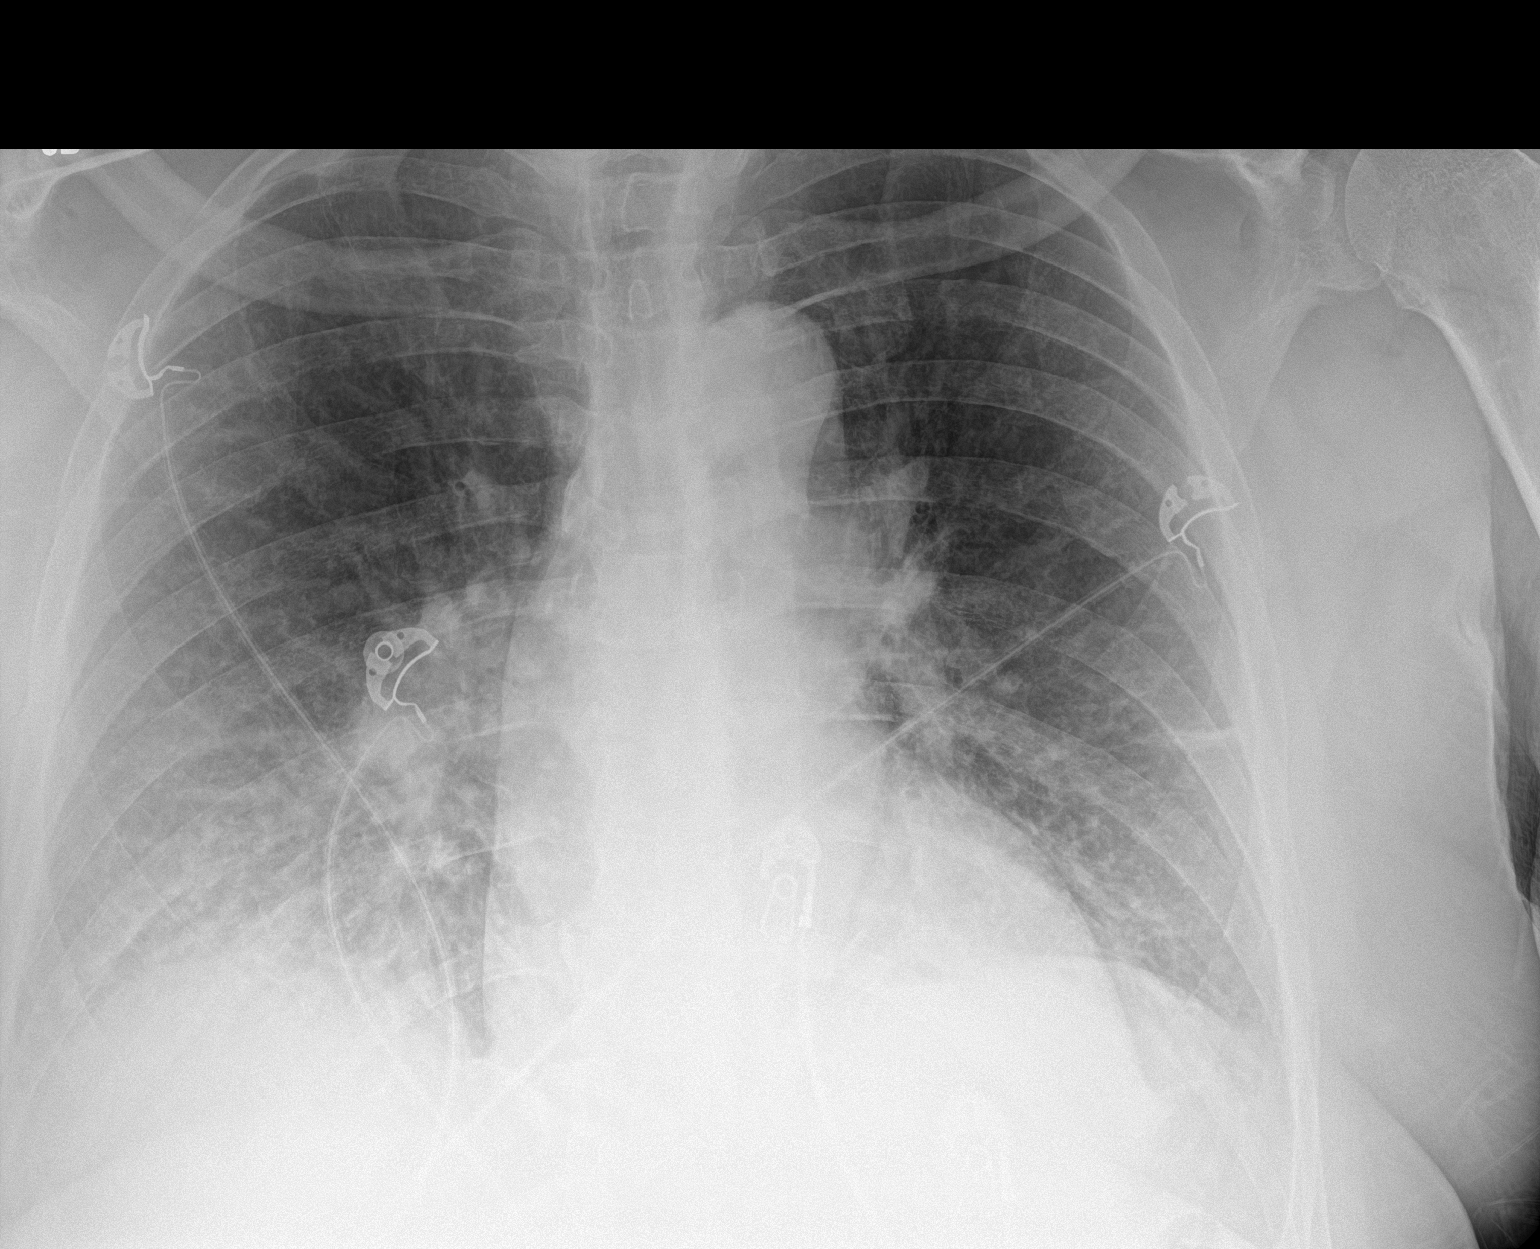

[1 of 1 positions shown; findings below may reference images not displayed]

FINDINGS: Cardiac shadow is enlarged but stable. Bibasilar infiltrative
changes are noted right greater than left. Mild vascular congestion
is noted as well. Some atelectatic changes are noted in the left mid
lung. No sizable effusion is seen.
IMPRESSION: New bibasilar infiltrates right greater than left.

Mild vascular congestion as well.

## 2018-12-30 ENCOUNTER — Encounter: Payer: Self-pay | Admitting: Physician Assistant

## 2018-12-30 ENCOUNTER — Telehealth (INDEPENDENT_AMBULATORY_CARE_PROVIDER_SITE_OTHER): Payer: Self-pay | Admitting: Physician Assistant

## 2018-12-30 ENCOUNTER — Telehealth: Payer: Self-pay

## 2018-12-30 VITALS — Ht 72.0 in | Wt 239.0 lb

## 2018-12-30 DIAGNOSIS — I10 Essential (primary) hypertension: Secondary | ICD-10-CM

## 2018-12-30 DIAGNOSIS — F1910 Other psychoactive substance abuse, uncomplicated: Secondary | ICD-10-CM

## 2018-12-30 DIAGNOSIS — Z79899 Other long term (current) drug therapy: Secondary | ICD-10-CM

## 2018-12-30 DIAGNOSIS — R931 Abnormal findings on diagnostic imaging of heart and coronary circulation: Secondary | ICD-10-CM

## 2018-12-30 DIAGNOSIS — K551 Chronic vascular disorders of intestine: Secondary | ICD-10-CM

## 2018-12-30 NOTE — Telephone Encounter (Signed)
Virtual Visit Pre-Appointment Phone Call  "(Name), I am calling you today to discuss your upcoming appointment. We are currently trying to limit exposure to the virus that causes COVID-19 by seeing patients at home rather than in the office."  1. "What is the BEST phone number to call the day of the visit?" - include this in appointment notes  2. "Do you have or have access to (through a family member/friend) a smartphone with video capability that we can use for your visit?" a. If yes - list this number in appt notes as "cell" (if different from BEST phone #) and list the appointment type as a VIDEO visit in appointment notes b. If no - list the appointment type as a PHONE visit in appointment notes  3. Confirm consent - "In the setting of the current Covid19 crisis, you are scheduled for a (phone or video) visit with your provider on (date) at (time).  Just as we do with many in-office visits, in order for you to participate in this visit, we must obtain consent.  If you'd like, I can send this to your mychart (if signed up) or email for you to review.  Otherwise, I can obtain your verbal consent now.  All virtual visits are billed to your insurance company just like a normal visit would be.  By agreeing to a virtual visit, we'd like you to understand that the technology does not allow for your provider to perform an examination, and thus may limit your provider's ability to fully assess your condition. If your provider identifies any concerns that need to be evaluated in person, we will make arrangements to do so.  Finally, though the technology is pretty good, we cannot assure that it will always work on either your or our end, and in the setting of a video visit, we may have to convert it to a phone-only visit.  In either situation, we cannot ensure that we have a secure connection.  Are you willing to proceed?" STAFF: Did the patient verbally acknowledge consent to telehealth visit? Document  YES/NO here: YES  4. Advise patient to be prepared - "Two hours prior to your appointment, go ahead and check your blood pressure, pulse, oxygen saturation, and your weight (if you have the equipment to check those) and write them all down. When your visit starts, your provider will ask you for this information. If you have an Apple Watch or Kardia device, please plan to have heart rate information ready on the day of your appointment. Please have a pen and paper handy nearby the day of the visit as well."  5. Give patient instructions for MyChart download to smartphone OR [email or web address] as below if video visit (depending on what platform provider is using)  6. Inform patient they will receive a phone call 15 minutes prior to their appointment time (may be from unknown caller ID) so they should be prepared to answer    Alex Liu has been deemed a candidate for a follow-up tele-health visit to limit community exposure during the Covid-19 pandemic. I spoke with the patient via phone to ensure availability of phone/video source, confirm preferred email & phone number, and discuss instructions and expectations.  I reminded Alex Liu to be prepared with any vital sign and/or heart rhythm information that could potentially be obtained via home monitoring, at the time of his visit. I reminded Alex Liu to expect a phone call prior to  his visit.  [name], CMA 12/30/2018 9:11 AM   INSTRUCTIONS FOR DOWNLOADING THE MYCHART APP TO SMARTPHONE  - The patient must first make sure to have activated MyChart and know their login information - If Apple, go to [employer] and type in MyChart in the search bar and download the app. If Android, ask patient to go to [employer] and type in [place] in the search bar and download the app. The app is free but as with any other app downloads, their phone may require them to verify saved payment information or  Apple/Android password.  - The patient will need to then log into the app with their MyChart username and password, and select [facility] as their healthcare provider to link the account. When it is time for your visit, go to the MyChart app, find appointments, and click Begin Video Visit. Be sure to Select Allow for your device to access the Microphone and Camera for your visit. You will then be connected, and your provider will be with you shortly.  **If they have any issues connecting, or need assistance please contact MyChart service desk [phone])**  **If using a computer, in order to ensure the best quality for their visit they will need to use either of the following Internet Browsers: [employer] or Google Chrome**  IF USING DOXIMITY or DOXY.ME - The patient will receive a link just prior to their visit by text.     FULL LENGTH CONSENT FOR TELE-HEALTH VISIT   I hereby voluntarily request, consent and authorize [facility] and its employed or contracted physicians, physician assistants, nurse practitioners or other licensed health care professionals (the Practitioner), to provide me with telemedicine health care services (the "Services") as deemed necessary by the treating Practitioner. I acknowledge and consent to receive the Services by the Practitioner via telemedicine. I understand that the telemedicine visit will involve communicating with the Practitioner through live audiovisual communication technology and the disclosure of certain medical information by electronic transmission. I acknowledge that I have been given the opportunity to request an in-person assessment or other available alternative prior to the telemedicine visit and am voluntarily participating in the telemedicine visit.  I understand that I have the right to withhold or withdraw my consent to the use of telemedicine in the course of my care at any time, without affecting my right to future care  or treatment, and that the Practitioner or I may terminate the telemedicine visit at any time. I understand that I have the right to inspect all information obtained and/or recorded in the course of the telemedicine visit and may receive copies of available information for a reasonable fee.  I understand that some of the potential risks of receiving the Services via telemedicine include:  Marland Kitchen Delay or interruption in medical evaluation due to technological equipment failure or disruption; . Information transmitted may not be sufficient (e.g. poor resolution of images) to allow for appropriate medical decision making by the Practitioner; and/or  . In rare instances, security protocols could fail, causing a breach of personal health information.  Furthermore, I acknowledge that it is my responsibility to provide information about my medical history, conditions and care that is complete and accurate to the best of my ability. I acknowledge that Practitioner's advice, recommendations, and/or decision may be based on factors not within their control, such as incomplete or inaccurate data provided by me or distortions of diagnostic images or specimens that may result from electronic transmissions. I  understand that the practice of medicine is not an exact science and that Practitioner makes no warranties or guarantees regarding treatment outcomes. I acknowledge that I will receive a copy of this consent concurrently upon execution via email to the email address I last provided but may also request a printed copy by calling the office of [facility].    I understand that my insurance will be billed for this visit.   I have read or had this consent read to me. . I understand the contents of this consent, which adequately explains the benefits and risks of the Services being provided via telemedicine.  . I have been provided ample opportunity to ask questions regarding this consent and the Services and have had  my questions answered to my satisfaction. . I give my informed consent for the services to be provided through the use of telemedicine in my medical care  By participating in this telemedicine visit I agree to the above.

## 2018-12-30 NOTE — Telephone Encounter (Signed)
Called patient to discuss AVS instructions gave [name] PA-C recommendations and patient voiced understanding. AVS summary mailed to patient.

## 2018-12-30 NOTE — Progress Notes (Signed)
Virtual Visit via Video Note   This visit type was conducted due to national recommendations for restrictions regarding the COVID-19 Pandemic (e.g. social distancing) in an effort to limit this patient's exposure and mitigate transmission in our community.  Due to his co-morbid illnesses, this patient is at least at moderate risk for complications without adequate follow up.  This format is felt to be most appropriate for this patient at this time.  All issues noted in this document were discussed and addressed.  A limited physical exam was performed with this format.  Please refer to the patient's chart for his consent to telehealth for [facility].   Date:  12/31/2018   ID:  Alex Liu, DOB 10-Mar-1966, MRN [id number]  Patient Location: Home Provider Location: Home  PCP:  Bing NeighborsHarris, Kimberly S, FNP  Cardiologist:  Peter SwazilandJordan, MD  Electrophysiologist:  None   Evaluation Performed:  Follow-Up Visit  Chief Complaint:  followup  History of Present Illness:    Alex Liu is a 53 y.o. male with PMH of HTN, CKD stage III, and history of polysubstance abuse with cocaine, tobacco and EtOH.  He was admitted in December 2018 with worsening shortness of breath.  He was on his way home to get his inhaler and apparently lost consciousness.  He was found by bystander, then later given Narcan in the field by EMS with concern of opiate overdose.  On arrival to the ED, he was severely hypertensive with blood pressure of 211/142.  Urine drug test was positive for cocaine, troponin was borderline elevated at 0.08.  BNP was 204.1.  Hemoglobin was normal.  Electrolyte was significant for potassium of 3.4.  He was treated for hypertensive urgency, he is not a candidate for beta-blocker due to cocaine use.  He also diagnosed with acute pulmonary edema and treated with IV Lasix.  Echocardiogram obtained on 02/25/2017 showed EF 50-55%, grade 1 diastolic dysfunction, akinesis of mid to apical inferoseptal  myocardium, mild AI, mildly dilated left and right atrium.  Given lack of anginal symptom, no further ischemic work-up was done despite wall motion abnormality. Ultrasound Doppler showed less than 60% renal artery stenosis bilaterally.  He was last seen by Dr. SwazilandJordan in May 2019 at which time he was doing well.  Lasix was stopped at that time as patient was euvolemic.  He has been doing well from cardiology perspective.  He denies any chest pain or shortness of breath.  He has not had any limitation to his functional ability.  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  Overall he is doing quite well, he can follow-up in 1 year.  I will obtain basic metabolic panel since he is on spironolactone to check renal function and electrolyte.  The patient does not have symptoms concerning for COVID-19 infection (fever, chills, cough, or new shortness of breath).    Past Medical History:  Diagnosis Date   Hypertension    Hypertensive heart disease with acute diastolic congestive heart failure (HCC)    Past Surgical History:  Procedure Laterality Date   BRAIN SURGERY       Current Meds  Medication Sig   amLODipine (NORVASC) 10 MG tablet TAKE 1 TABLET (10 MG TOTAL) BY MOUTH DAILY.   aspirin EC 81 MG tablet Take 1 tablet (81 mg total) by mouth daily.   hydrALAZINE (APRESOLINE) 100 MG tablet Take 1 tablet (100  mg total) by mouth every 8 (eight) hours. OFFICE VISIT NEEDED   isosorbide mononitrate (IMDUR) 60 MG 24 hr tablet TAKE 1 TABLET (60 MG TOTAL) BY MOUTH DAILY.   labetalol (NORMODYNE) 300 MG tablet Take 1 tablet (300 mg total) by mouth 2 (two) times daily. OFFICE VISIT NEEDED   spironolactone (ALDACTONE) 25 MG tablet TAKE 1 TABLET (25 MG TOTAL) BY MOUTH DAILY.     Allergies:   Patient has no known allergies.   Social History   Tobacco Use   Smoking status: Heavy  Tobacco Smoker    Packs/day: 1.00    Types: Cigars   Smokeless tobacco: Never Used  Substance Use Topics   Alcohol use: Yes   Drug use: Yes    Types: Cocaine     Family Hx: The patient's family history includes CAD in his brother; CAD (age of onset: 2) in his brother; Coronary artery disease in his brother; Hypertension in his mother.  ROS:   Please see the history of present illness.     All other systems reviewed and are negative.   Prior CV studies:   The following studies were reviewed today:  Echo 02/25/2017 LV EF: 50% -   55%  ------------------------------------------------------------------- Indications:      Chest pain 786.51.  ------------------------------------------------------------------- History:   PMH:  Hypertensive heart disease Acute pulmonary edema Congestive heart failure.  ------------------------------------------------------------------- Study Conclusions  - Left ventricle: The cavity size was normal. There was mild   concentric hypertrophy. Systolic function was normal. The   estimated ejection fraction was in the range of 50% to 55%. There   is akinesis of the mid-apicalinferoseptal myocardium. Doppler   parameters are consistent with abnormal left ventricular   relaxation (grade 1 diastolic dysfunction). - Aortic valve: There was mild regurgitation. - Left atrium: The atrium was mildly dilated. - Right atrium: The atrium was mildly dilated. - Tricuspid valve: There was trivial regurgitation.  Labs/Other Tests and Data Reviewed:    EKG:  An ECG dated 12/20/2017 was personally reviewed today and demonstrated:  Normal sinus rhythm without significant ST-T wave changes  Recent Labs: No results found for requested labs within last 8760 hours.   Recent Lipid Panel Lab Results  Component Value Date/Time   CHOL 186 02/25/2017 12:40 AM   TRIG 76 02/25/2017 12:40 AM   HDL 40 (L) 02/25/2017 12:40 AM   CHOLHDL 4.7 02/25/2017 12:40 AM     LDLCALC 131 (H) 02/25/2017 12:40 AM    Wt Readings from Last 3 Encounters:  12/30/18 239 lb (108.4 kg)  12/20/17 228 lb (103.4 kg)  11/28/17 244 lb (110.7 kg)     Objective:    Vital Signs:  Ht 6' (1.829 m)    Wt 239 lb (108.4 kg)    BMI 32.41 kg/m    VITAL SIGNS:  reviewed  ASSESSMENT & PLAN:    1. Hypertension: No vital signs was available during today's visit, continue on current therapy  2. Polysubstance abuse: History of cocaine abuse, alcohol and tobacco abuse.  3. Abnormal echocardiogram: Despite previous wall motion abnormality, however patient does not have any anginal symptoms.  We will continue medical therapy.  4. Mesenteric artery stenosis: No obvious postprandial abdominal pain, nausea or vomiting.   COVID-19 Education: The signs and symptoms of COVID-19 were discussed with the patient and how to seek care for testing (follow up with PCP or arrange E-visit).  The importance of social distancing was discussed today.  Time:   Today, I have  spent 7 minutes with the patient with telehealth technology discussing the above problems.     Medication Adjustments/Labs and Tests Ordered: Current medicines are reviewed at length with the patient today.  Concerns regarding medicines are outlined above.   Tests Ordered: Orders Placed This Encounter  Procedures   Basic metabolic panel    Medication Changes: No orders of the defined types were placed in this encounter.   Follow Up:  In Person in 1 year(s)  Signed, [name], [place]  12/31/2018 11:53 PM    [facility]

## 2018-12-30 NOTE — Patient Instructions (Signed)
Medication Instructions:   Your physician recommends that you continue on your current medications as directed. Please refer to the Current Medication list given to you today.  *If you need a refill on your cardiac medications before your next appointment, please call your pharmacy*  Lab Work:  You will need to have labs (blood work) drawn in 3-4 weeks  If you have labs (blood work) drawn today and your tests are completely normal, you will receive your results only by: Marland Kitchen MyChart Message (if you have MyChart) OR . A paper copy in the mail If you have any lab test that is abnormal or we need to change your treatment, we will call you to review the results.  Testing/Procedures:   Follow-Up: At [facility], you and your health needs are our priority.  As part of our continuing mission to provide you with exceptional heart care, we have created designated Provider Care Teams.  These Care Teams include your primary Cardiologist (physician) and Advanced Practice Providers (APPs -  Physician Assistants and Nurse Practitioners) who all work together to provide you with the care you need, when you need it.  Your next appointment:   You need to follow up in 12 months-October 2021. You will need to contact our office in July/August 2021 to make this appointment with,   The format for your next appointment:   In Person  Provider:   Peter Martinique, MD  Other Instructions

## 2019-01-20 MED FILL — ?SPIRONOLACTONE 25 MG TABLE: 25 | 30 days supply | Qty: 30 | Fill #5

## 2019-01-20 MED FILL — ISOSORBIDE MN ER 60 MG TAB: 60 | 30 days supply | Qty: 30 | Fill #4

## 2019-01-22 IMAGING — US US RENAL
1 series · 14 of 25 positions shown · non-contrast
Comparison: Ultrasound February 28, 2012.

CLINICAL DATA: Elevated cm creatinine.

EXAM:
RENAL / URINARY TRACT ULTRASOUND COMPLETE

[Series 1: us renal · 0.26mm/px · 14 of 44 slices shown]
[im 1/44]
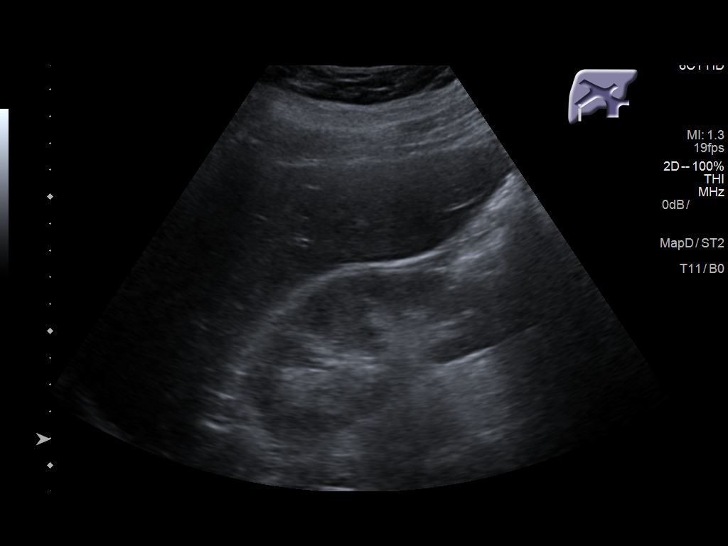
[im 4/44]
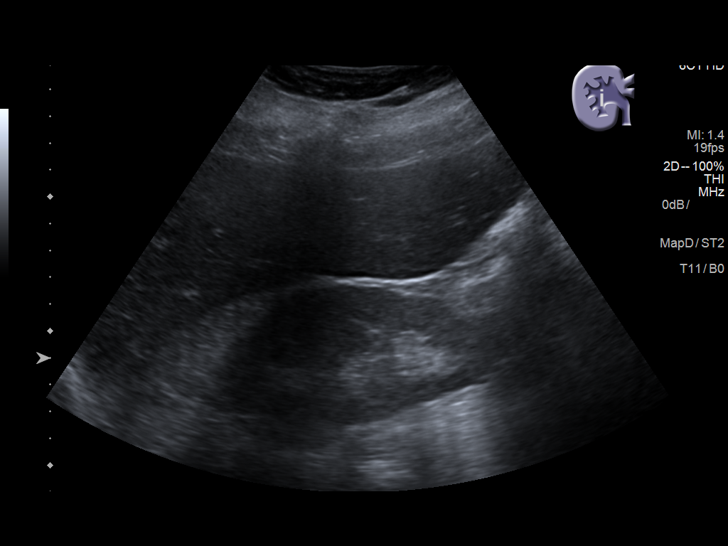
[im 8/44]
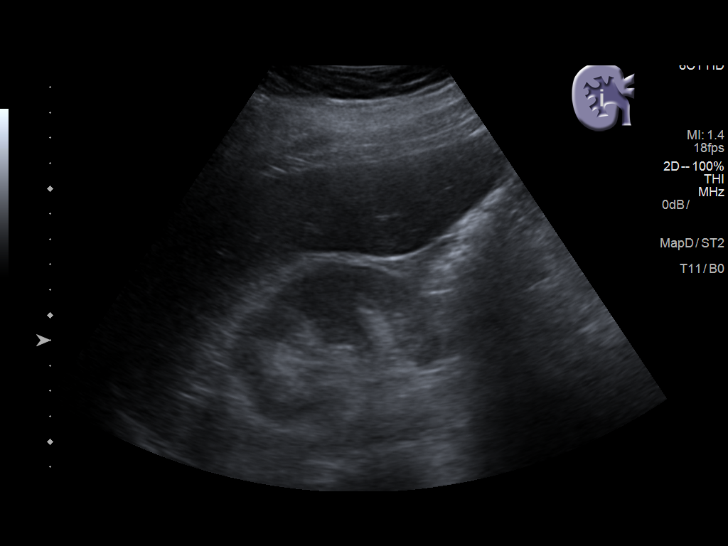
[im 11/44]
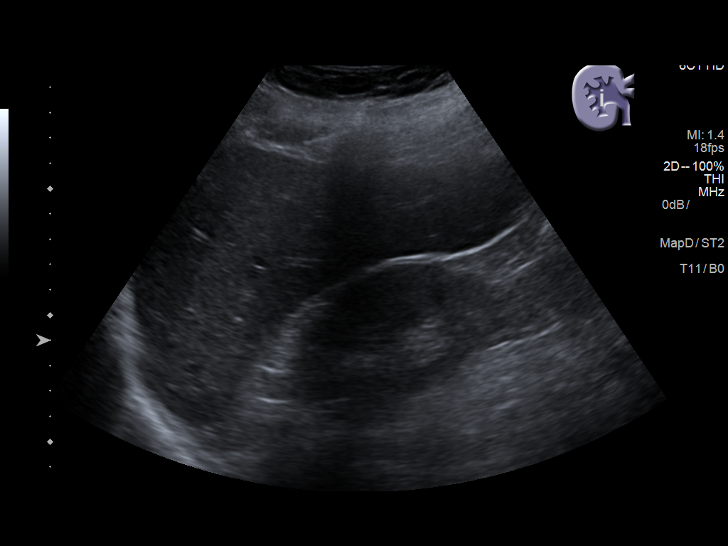
[im 15/44]
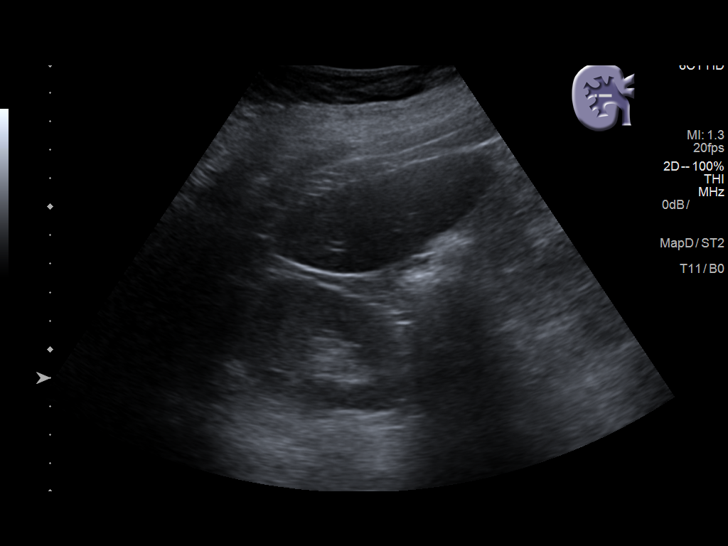
[im 17/44]
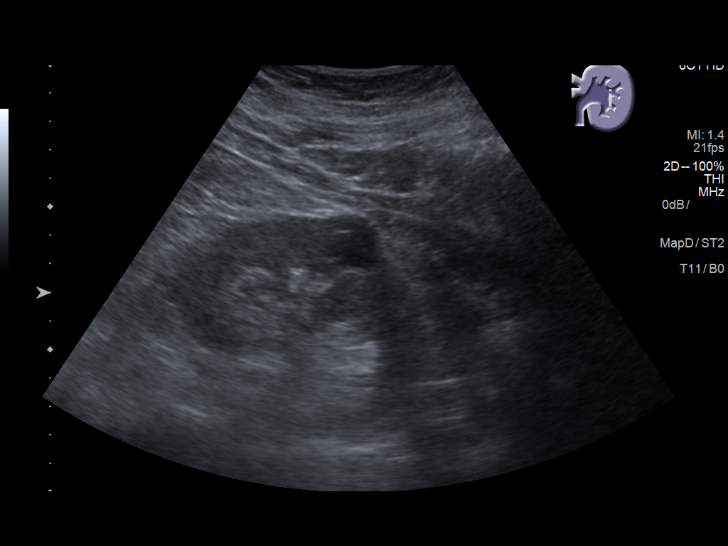
[im 20/44]
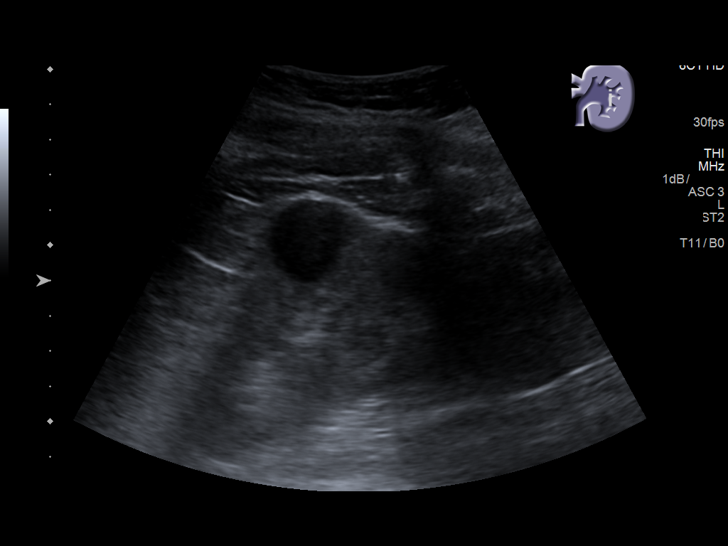
[im 24/44]
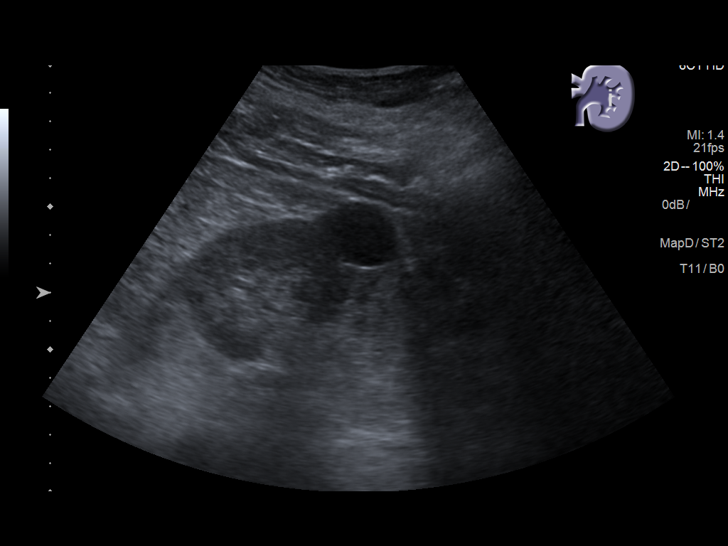
[im 27/44]
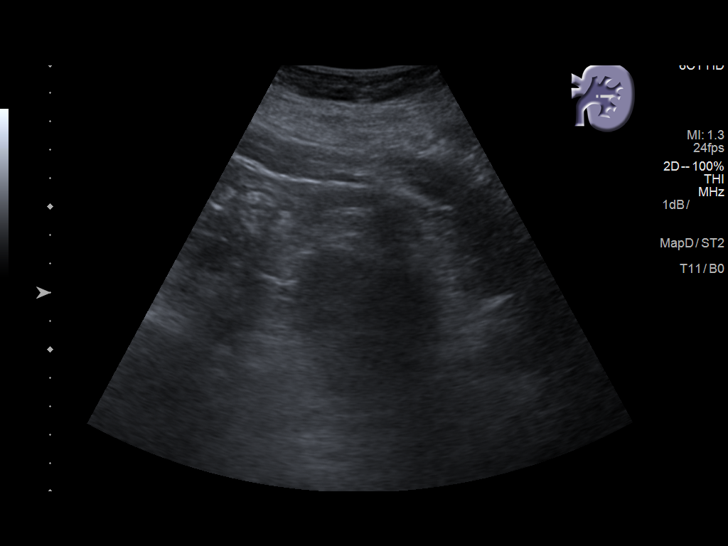
[im 29/44]
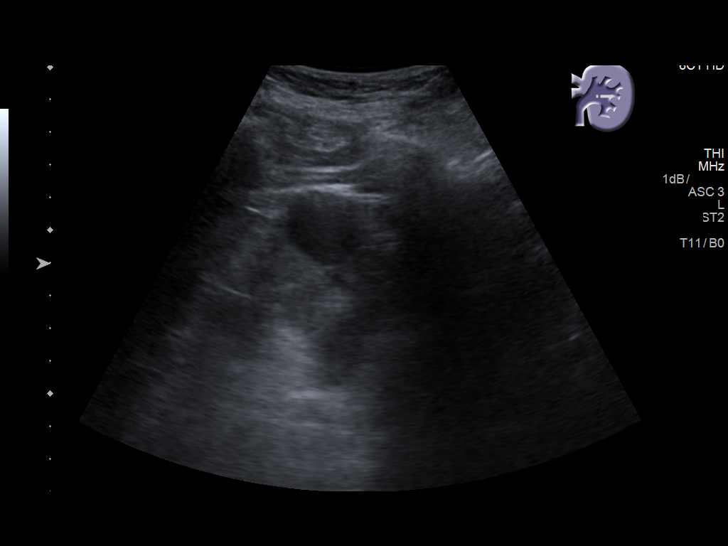
[im 33/44]
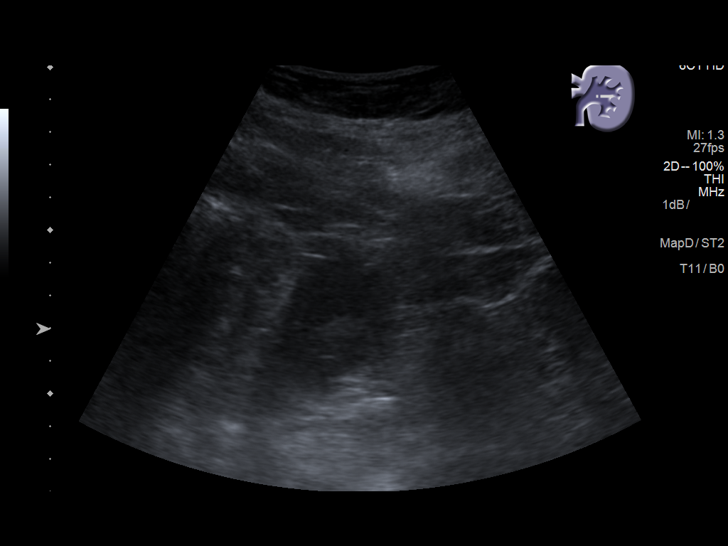
[im 36/44]
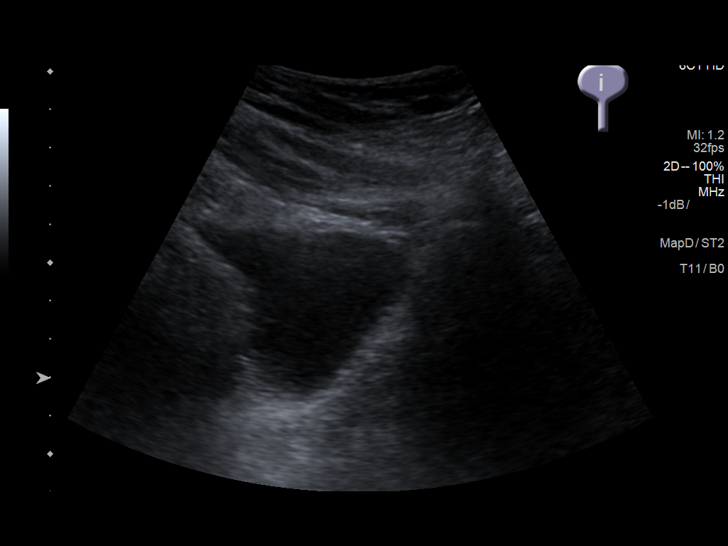
[im 40/44]
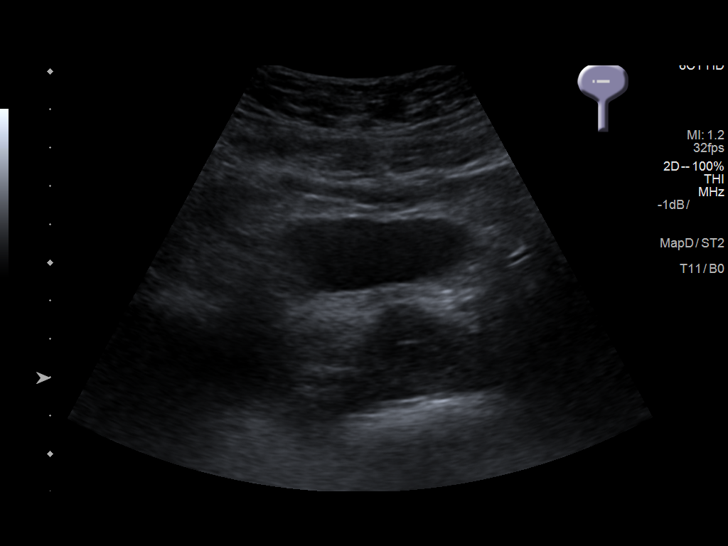
[im 44/44]
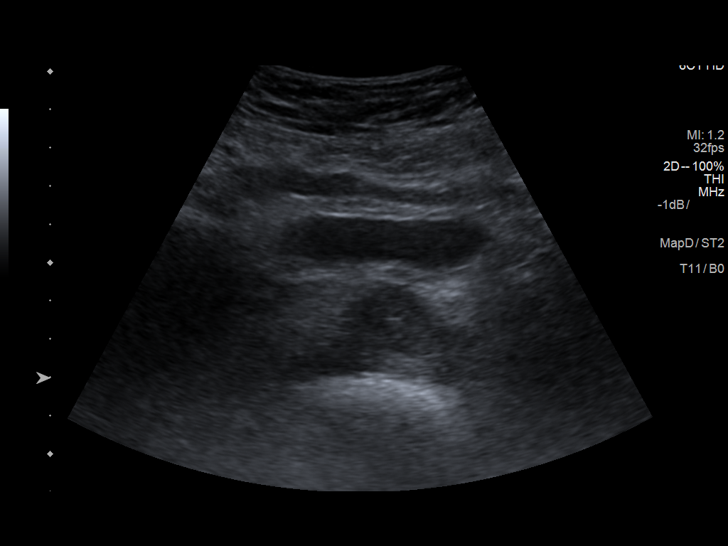

[14 of 25 positions shown; findings below may reference images not displayed]

FINDINGS: Right Kidney:

Length: 10.7 cm. Echogenicity within normal limits. No mass or
hydronephrosis visualized.

Left Kidney:

Length: 9.5 cm. 2.4 cm simple cyst seen in midpole. Echogenicity
within normal limits. No mass or hydronephrosis visualized.

Bladder:

Appears normal for degree of bladder distention.
IMPRESSION: Left renal simple cyst.  No other abnormality seen.

## 2019-02-13 MED FILL — LABETALOL HCL 300 MG TABS: 300 | 30 days supply | Qty: 60 | Fill #1

## 2019-02-25 ENCOUNTER — Other Ambulatory Visit: Payer: Self-pay | Admitting: Cardiology

## 2019-02-25 MED FILL — ISOSORBIDE MN ER 60 MG TAB: 60 | 30 days supply | Qty: 30 | Fill #5

## 2019-02-25 MED FILL — hydrALAZINE HCL 100 MG TABS: 100 | 30 days supply | Qty: 90 | Fill #1

## 2019-02-25 MED FILL — ?AMLODIPINE BESYLATE 10 MG: 10 | 30 days supply | Qty: 30 | Fill #0

## 2019-02-26 MED FILL — ?SPIRONOLACTONE 25 MG TABLE: 25 | 30 days supply | Qty: 30 | Fill #0

## 2019-03-24 ENCOUNTER — Other Ambulatory Visit: Payer: Self-pay | Admitting: Cardiology

## 2019-03-24 ENCOUNTER — Other Ambulatory Visit: Payer: Self-pay | Admitting: Physician Assistant

## 2019-03-24 MED FILL — ?AMLODIPINE BESYLATE 10 MG: 10 | 15 days supply | Qty: 15 | Fill #1

## 2019-03-24 MED FILL — ?SPIRONOLACTONE 25 MG TABLE: 25 | 30 days supply | Qty: 30 | Fill #1

## 2019-03-25 MED FILL — ISOSORBIDE MN ER 60 MG TAB: 60 | 30 days supply | Qty: 30 | Fill #0

## 2019-03-25 MED FILL — LABETALOL HCL 300 MG TABS: 300 | 15 days supply | Qty: 30 | Fill #0

## 2019-04-10 ENCOUNTER — Other Ambulatory Visit: Payer: Self-pay | Admitting: Physician Assistant

## 2019-04-10 ENCOUNTER — Other Ambulatory Visit: Payer: Self-pay | Admitting: Cardiology

## 2019-04-10 MED FILL — LABETALOL HCL 300 MG TABS: 300 | 15 days supply | Qty: 30 | Fill #1

## 2019-04-10 MED FILL — hydrALAZINE HCL 100 MG TABS: 100 | 30 days supply | Qty: 90 | Fill #0

## 2019-04-10 MED FILL — AMLODIPINE BESYLATE 10 MG T: 10 | 15 days supply | Qty: 15 | Fill #0

## 2019-04-28 ENCOUNTER — Other Ambulatory Visit: Payer: Self-pay | Admitting: Physician Assistant

## 2019-04-28 ENCOUNTER — Other Ambulatory Visit: Payer: Self-pay | Admitting: Cardiology

## 2019-04-28 MED FILL — LABETALOL HCL 300 MG TABS: 300 | 30 days supply | Qty: 60 | Fill #0

## 2019-04-28 MED FILL — ISOSORBIDE MN ER 60 MG TAB: 60 | 30 days supply | Qty: 30 | Fill #1

## 2019-04-28 MED FILL — ?AMLODIPINE BESYL 10MG TABL: 10 | 30 days supply | Qty: 30 | Fill #0

## 2019-04-28 MED FILL — ?SPIRONOLACTONE 25 MG TABLE: 25 | 30 days supply | Qty: 30 | Fill #0

## 2019-05-15 MED FILL — hydrALAZINE HCL 100 MG TABS: 100 | 30 days supply | Qty: 90 | Fill #1

## 2019-05-29 MED FILL — AMLODIPINE BESYLATE 10 MG T: 10 | 30 days supply | Qty: 30 | Fill #1

## 2019-05-29 MED FILL — ?SPIRONOLACTONE 25 MG TABLE: 25 | 30 days supply | Qty: 30 | Fill #1

## 2019-05-29 MED FILL — ISOSORBIDE MN ER 60 MG TAB: 60 | 30 days supply | Qty: 30 | Fill #2

## 2019-06-04 MED FILL — LABETALOL HCL 300 MG TABS: 300 | 30 days supply | Qty: 60 | Fill #1

## 2019-06-22 ENCOUNTER — Other Ambulatory Visit: Payer: Self-pay | Admitting: Physician Assistant

## 2019-06-23 ENCOUNTER — Other Ambulatory Visit: Payer: Self-pay | Admitting: Physician Assistant

## 2019-06-23 MED FILL — hydrALAZINE HCL 100 MG TABS: 100 | 30 days supply | Qty: 90 | Fill #0

## 2019-06-29 MED FILL — AMLODIPINE BESYLATE 10 MG T: 10 | 30 days supply | Qty: 30 | Fill #2

## 2019-06-29 MED FILL — ?SPIRONOLACTONE 25 MG TABLE: 25 | 30 days supply | Qty: 30 | Fill #2

## 2019-07-03 ENCOUNTER — Other Ambulatory Visit: Payer: Self-pay | Admitting: Cardiology

## 2019-07-06 ENCOUNTER — Other Ambulatory Visit: Payer: Self-pay | Admitting: Physician Assistant

## 2019-07-07 MED FILL — ISOSORBIDE MN ER 60 MG TAB: 60 | 30 days supply | Qty: 30 | Fill #0

## 2019-07-14 MED FILL — LABETALOL HCL 300 MG TABS: 300 | 30 days supply | Qty: 60 | Fill #2

## 2019-07-31 MED FILL — AMLODIPINE BESYLATE 10 MG T: 10 | 30 days supply | Qty: 30 | Fill #3

## 2019-07-31 MED FILL — SPIRONOLACTONE 25 MG TABLET: 25 | 30 days supply | Qty: 30 | Fill #3

## 2019-08-04 MED FILL — hydrALAZINE HCL 100 MG TABS: 100 | 30 days supply | Qty: 90 | Fill #1

## 2019-08-11 MED FILL — ISOSORBIDE MN ER 60 MG TAB: 60 | 30 days supply | Qty: 30 | Fill #1

## 2019-08-24 MED FILL — LABETALOL HCL 300 MG TABS: 300 | 30 days supply | Qty: 60 | Fill #3

## 2019-09-01 MED FILL — AMLODIPINE BESYLATE 10 MG T: 10 | 30 days supply | Qty: 30 | Fill #4

## 2019-09-01 MED FILL — SPIRONOLACTONE 25 MG TABLET: 25 | 30 days supply | Qty: 30 | Fill #4

## 2019-09-15 MED FILL — hydrALAZINE HCL 100 MG TABS: 100 | 30 days supply | Qty: 90 | Fill #2

## 2019-09-15 MED FILL — ISOSORBIDE MN ER 60 MG TAB: 60 | 30 days supply | Qty: 30 | Fill #2

## 2019-09-29 IMAGING — DX DG CHEST 2V
2 series · 2 of 2 positions shown · non-contrast
Comparison: 02/24/2017

CLINICAL DATA: Wheezing and shortness of breath

EXAM:
CHEST - 2 VIEW

[chest pa]
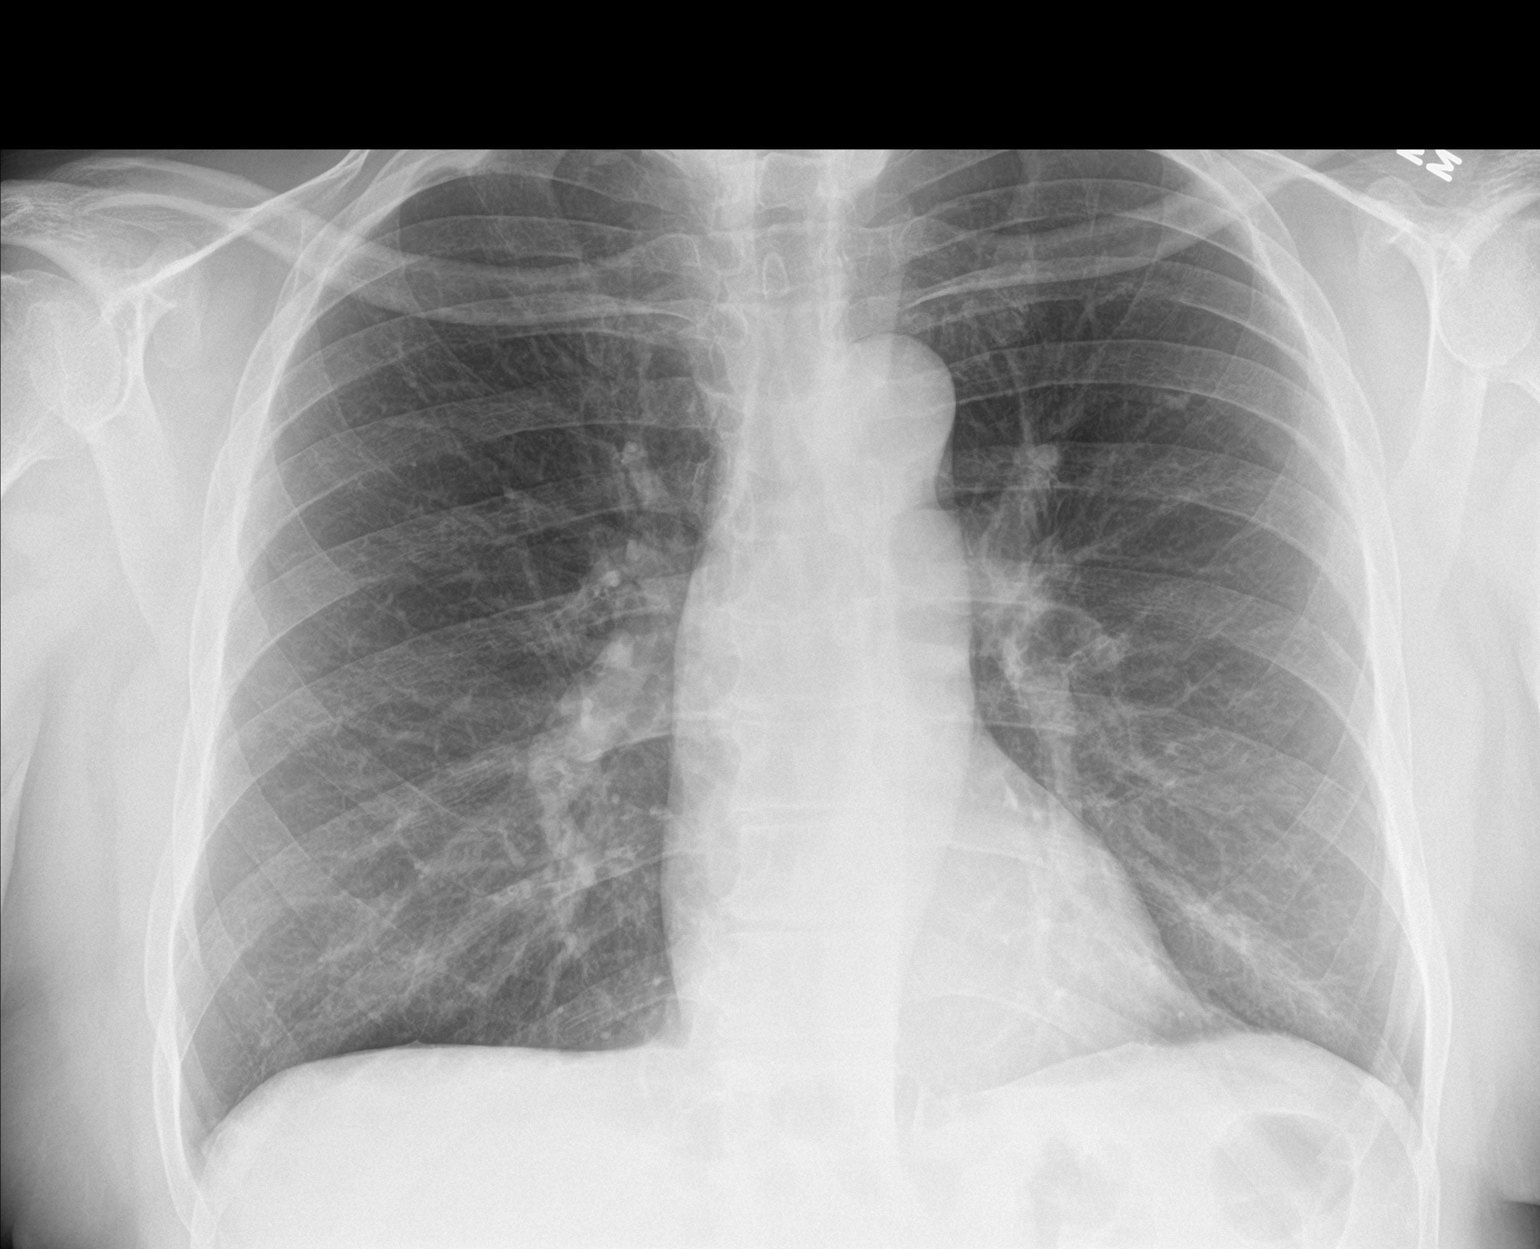

[chest lat]
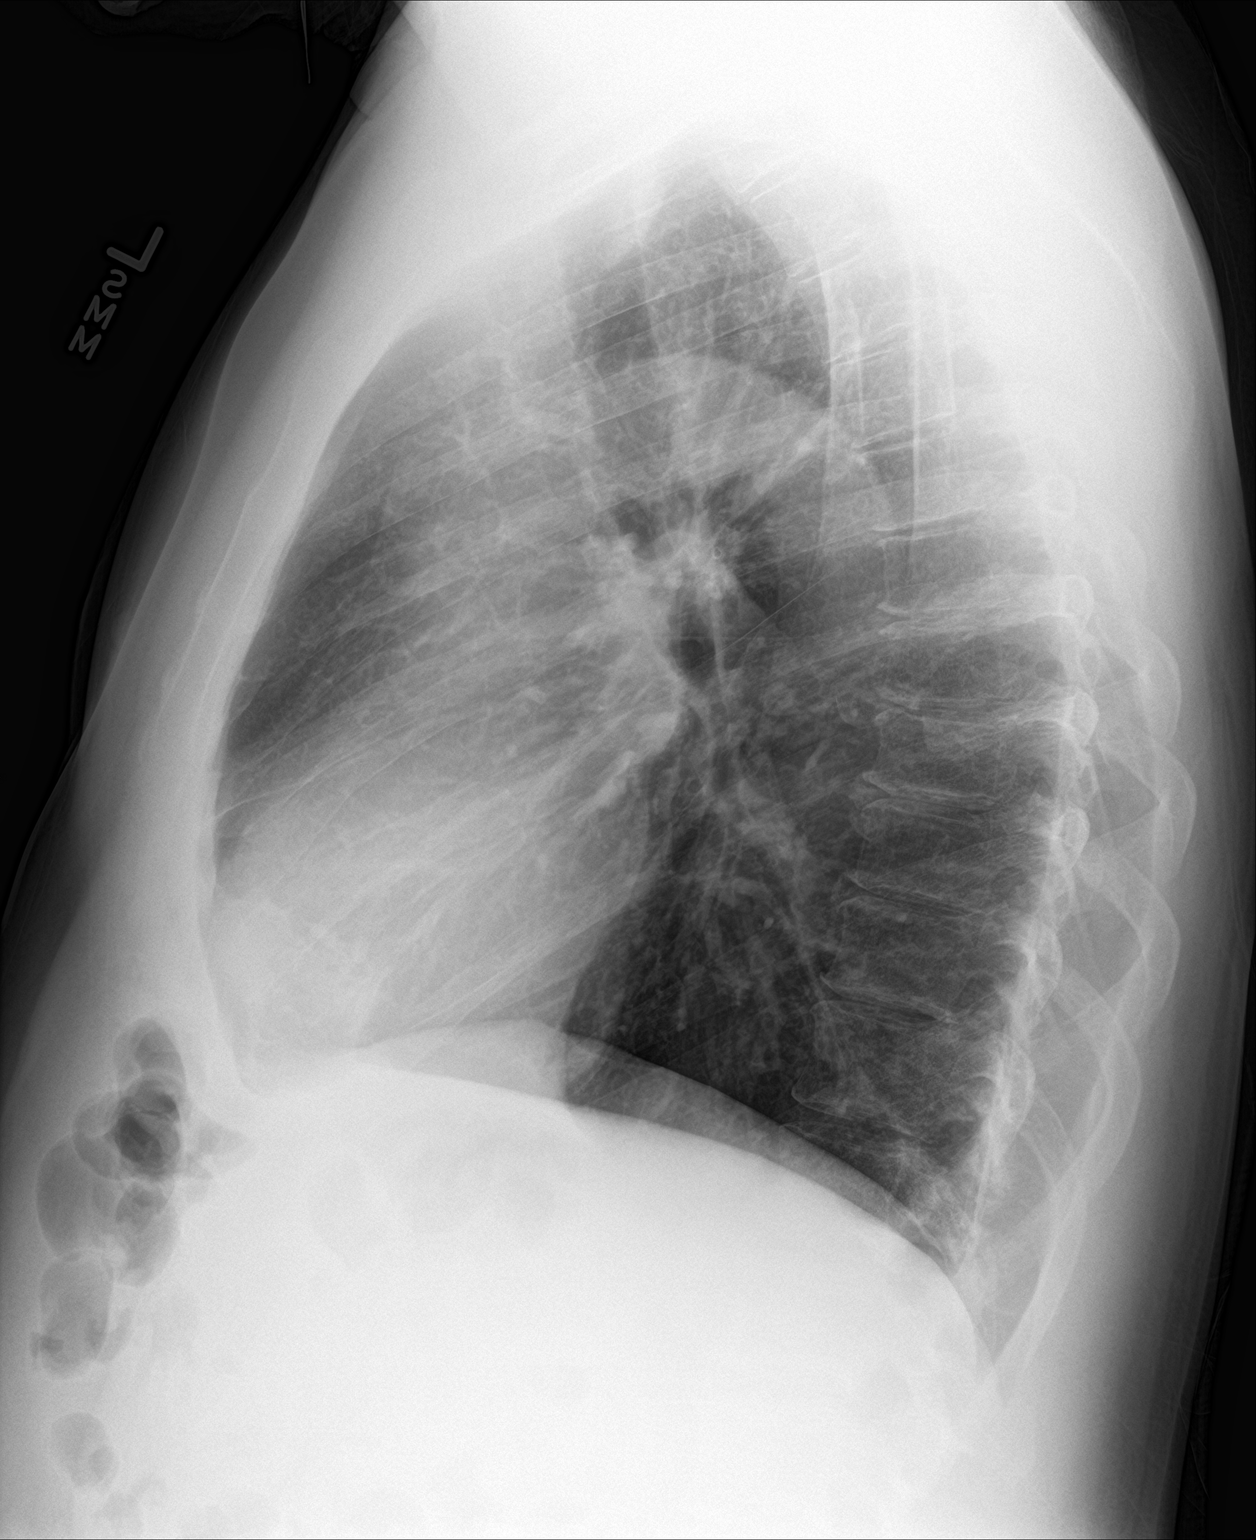

[2 of 2 positions shown; findings below may reference images not displayed]

FINDINGS: The heart size and mediastinal contours are within normal limits.
Both lungs are clear. The visualized skeletal structures are
unremarkable.
IMPRESSION: Clear lungs.

## 2019-10-02 MED FILL — AMLODIPINE BESYLATE 10 MG T: 10 | 30 days supply | Qty: 30 | Fill #5

## 2019-10-02 MED FILL — LABETALOL HCL 300 MG TABS: 300 | 30 days supply | Qty: 60 | Fill #4

## 2019-10-02 MED FILL — SPIRONOLACTONE 25 MG TABLET: 25 | 30 days supply | Qty: 30 | Fill #5

## 2019-10-20 MED FILL — ISOSORBIDE MN ER 60 MG TAB: 60 | 30 days supply | Qty: 30 | Fill #3

## 2019-10-27 MED FILL — hydrALAZINE HCL 100 MG TABS: 100 | 30 days supply | Qty: 90 | Fill #3

## 2019-11-05 MED FILL — AMLODIPINE BESYLATE 10 MG T: 10 | 30 days supply | Qty: 30 | Fill #6

## 2019-11-05 MED FILL — SPIRONOLACTONE 25 MG TABLET: 25 | 30 days supply | Qty: 30 | Fill #6

## 2019-11-23 MED FILL — ISOSORBIDE MN ER 60 MG TAB: 60 | 30 days supply | Qty: 30 | Fill #4

## 2019-11-23 MED FILL — LABETALOL HCL 300 MG TABS: 300 | 30 days supply | Qty: 60 | Fill #5

## 2019-12-07 MED FILL — SPIRONOLACTONE 25 MG TABLET: 25 | 30 days supply | Qty: 30 | Fill #7

## 2019-12-07 MED FILL — AMLODIPINE BESYLATE 10 MG T: 10 | 30 days supply | Qty: 30 | Fill #7

## 2019-12-09 MED FILL — hydrALAZINE HCL 100 MG TABS: 100 | 30 days supply | Qty: 90 | Fill #4

## 2019-12-28 MED FILL — ISOSORBIDE MN ER 60 MG TAB: 60 | 30 days supply | Qty: 30 | Fill #5

## 2020-01-08 ENCOUNTER — Other Ambulatory Visit: Payer: Self-pay | Admitting: Cardiology

## 2020-01-08 MED FILL — LABETALOL HCL 300 MG TABLET: 300 | 30 days supply | Qty: 60 | Fill #6

## 2020-01-08 MED FILL — AMLODIPINE BESYLATE 10 MG T: 10 | 30 days supply | Qty: 30 | Fill #0

## 2020-01-08 MED FILL — SPIRONOLACTONE 25 MG TABLET: 25 | 30 days supply | Qty: 30 | Fill #0

## 2020-01-20 MED FILL — hydrALAZINE HCL 100 MG TABS: 100 | 30 days supply | Qty: 90 | Fill #5

## 2020-02-10 MED FILL — ISOSORBIDE MN ER 60 MG TAB: 60 | 30 days supply | Qty: 30 | Fill #6

## 2020-02-10 MED FILL — AMLODIPINE BESYLATE 10 MG T: 10 | 30 days supply | Qty: 30 | Fill #1

## 2020-02-10 MED FILL — SPIRONOLACTONE 25 MG TABLET: 25 | 30 days supply | Qty: 30 | Fill #1

## 2020-02-22 MED FILL — LABETALOL HCL 300 MG TABLET: 300 | 30 days supply | Qty: 60 | Fill #7

## 2020-03-02 ENCOUNTER — Other Ambulatory Visit: Payer: Self-pay | Admitting: Physician Assistant

## 2020-03-02 ENCOUNTER — Other Ambulatory Visit: Payer: Self-pay | Admitting: Cardiology

## 2020-03-02 MED FILL — hydrALAZINE HCL 100 MG TABS: 100 | 30 days supply | Qty: 90 | Fill #0

## 2020-03-21 MED FILL — AMLODIPINE BESYLATE 10 MG T: 10 | 30 days supply | Qty: 30 | Fill #2

## 2020-03-21 MED FILL — SPIRONOLACTONE 25 MG TABLET: 25 | 30 days supply | Qty: 30 | Fill #2

## 2020-03-21 MED FILL — ISOSORBIDE MN ER 60 MG TAB: 60 | 30 days supply | Qty: 30 | Fill #7

## 2020-04-11 ENCOUNTER — Other Ambulatory Visit: Payer: Self-pay | Admitting: Physician Assistant

## 2020-04-12 ENCOUNTER — Other Ambulatory Visit: Payer: Self-pay | Admitting: Cardiology

## 2020-04-12 MED FILL — LABETALOL HCL 300 MG TABLET: 300 | 15 days supply | Qty: 30 | Fill #0

## 2020-04-25 MED FILL — hydrALAZINE HCL 100 MG TABS: 100 | 30 days supply | Qty: 90 | Fill #1

## 2020-04-28 MED FILL — AMLODIPINE BESYLATE 10 MG T: 10 | 30 days supply | Qty: 30 | Fill #3

## 2020-04-28 MED FILL — SPIRONOLACTONE 25 MG TABLET: 25 | 30 days supply | Qty: 30 | Fill #3

## 2020-04-28 MED FILL — ISOSORBIDE MN ER 60 MG TAB: 60 | 30 days supply | Qty: 30 | Fill #8

## 2020-05-09 ENCOUNTER — Other Ambulatory Visit: Payer: Self-pay | Admitting: Cardiology

## 2020-05-10 ENCOUNTER — Other Ambulatory Visit: Payer: Self-pay | Admitting: Cardiology

## 2020-05-10 MED FILL — LABETALOL HCL 300 MG TABLET: 300 | 15 days supply | Qty: 30 | Fill #0

## 2020-06-08 ENCOUNTER — Other Ambulatory Visit: Payer: Self-pay | Admitting: Cardiology

## 2020-06-08 MED FILL — AMLODIPINE BESYLATE 10 MG T: 10 | 30 days supply | Qty: 30 | Fill #4

## 2020-06-08 MED FILL — ISOSORBIDE MN ER 60 MG TAB: 60 | 30 days supply | Qty: 30 | Fill #9

## 2020-06-08 MED FILL — LABETALOL HCL 300 MG TABLET: 300 | 15 days supply | Qty: 30 | Fill #0

## 2020-06-08 MED FILL — SPIRONOLACTONE 25 MG TABLET: 25 | 30 days supply | Qty: 30 | Fill #4

## 2020-06-14 ENCOUNTER — Other Ambulatory Visit: Payer: Self-pay

## 2020-06-14 MED FILL — Hydralazine HCl Tab 100 MG: ORAL | 30 days supply | Qty: 90 | Fill #0 | Status: AC

## 2020-07-13 ENCOUNTER — Other Ambulatory Visit: Payer: Self-pay

## 2020-07-13 ENCOUNTER — Other Ambulatory Visit: Payer: Self-pay | Admitting: Physician Assistant

## 2020-07-13 ENCOUNTER — Other Ambulatory Visit: Payer: Self-pay | Admitting: Cardiology

## 2020-07-13 MED ORDER — LABETALOL HCL 300 MG PO TABS
ORAL_TABLET | ORAL | 0 refills | Status: DC
  Filled 2020-07-13: qty 30, 15d supply, fill #0

## 2020-07-13 MED ORDER — ISOSORBIDE MONONITRATE ER 60 MG PO TB24
ORAL_TABLET | Freq: Every day | ORAL | 0 refills | Status: DC
  Filled 2020-07-13: qty 30, 30d supply, fill #0

## 2020-07-13 MED FILL — Spironolactone Tab 25 MG: ORAL | 30 days supply | Qty: 30 | Fill #0 | Status: AC

## 2020-07-13 MED FILL — Amlodipine Besylate Tab 10 MG (Base Equivalent): ORAL | 30 days supply | Qty: 30 | Fill #0 | Status: AC

## 2020-07-14 ENCOUNTER — Other Ambulatory Visit: Payer: Self-pay

## 2020-07-15 ENCOUNTER — Other Ambulatory Visit: Payer: Self-pay

## 2020-08-11 ENCOUNTER — Other Ambulatory Visit: Payer: Self-pay

## 2020-08-11 ENCOUNTER — Telehealth: Payer: Self-pay | Admitting: Cardiology

## 2020-08-11 MED ORDER — ISOSORBIDE MONONITRATE ER 60 MG PO TB24
60.0000 mg | ORAL_TABLET | Freq: Every day | ORAL | 1 refills | Status: DC
  Filled 2020-08-11: qty 30, 30d supply, fill #0
  Filled 2020-09-20: qty 30, 30d supply, fill #1

## 2020-08-11 MED ORDER — LABETALOL HCL 300 MG PO TABS
ORAL_TABLET | ORAL | 1 refills | Status: DC
  Filled 2020-08-11: qty 60, 30d supply, fill #0
  Filled 2020-10-07: qty 60, 30d supply, fill #1

## 2020-08-11 MED ORDER — SPIRONOLACTONE 25 MG PO TABS
25.0000 mg | ORAL_TABLET | Freq: Every day | ORAL | 1 refills | Status: DC
  Filled 2020-08-11: qty 30, 30d supply, fill #0
  Filled 2020-09-20: qty 30, 30d supply, fill #1

## 2020-08-11 MED ORDER — HYDRALAZINE HCL 100 MG PO TABS
100.0000 mg | ORAL_TABLET | Freq: Three times a day (TID) | ORAL | 1 refills | Status: DC
  Filled 2020-08-11: qty 90, 30d supply, fill #0
  Filled 2020-10-02: qty 90, 30d supply, fill #1

## 2020-08-11 NOTE — Telephone Encounter (Signed)
*  STAT* If patient is at the pharmacy, call can be transferred to refill team.   1. Which medications need to be refilled? (please list name of each medication and dose if known) hydrALAZINE (APRESOLINE) 100 MG tablet, spironolactone (ALDACTONE) 25 MG tablet  2. Which pharmacy/location (including street and city if local pharmacy) is medication to be sent to?   3. Do they need a 30 day or 90 day supply?   [facility] Phone:  [phone]          Hydralazine ( apresoline )has 90days supply Every other rx is 30ds

## 2020-08-11 NOTE — Telephone Encounter (Signed)
Returned call to patient who states that he needs his cardiac medications: Hydralazine 100mg  Every 8 hours, Isosorbide 60 mg daily, Labetalol 300mg  twice daily, and Sprionolactone 25mg  once daily. Patient is over due for OV- OV scheduled with PA-C on August 11th 2022.   Refills until appointment have been sent in. Advised patient to keep this appointment. Patient verbalized understanding.

## 2020-09-20 ENCOUNTER — Other Ambulatory Visit: Payer: Self-pay

## 2020-10-03 ENCOUNTER — Other Ambulatory Visit: Payer: Self-pay

## 2020-10-07 ENCOUNTER — Other Ambulatory Visit: Payer: Self-pay

## 2020-10-16 NOTE — Progress Notes (Signed)
Cardiology Clinic Note   Patient Name: Alex Liu Date of Encounter: 10/20/2020  Primary Care Provider:  Pcp, No Primary Cardiologist:  Peter Swaziland, MD  Patient Profile    Alex Liu 55 year old male presents the clinic today for follow-up evaluation of his hypertension and diastolic CHF.  Past Medical History    Past Medical History:  Diagnosis Date   Hypertension    Hypertensive heart disease with acute diastolic congestive heart failure (HCC)    Past Surgical History:  Procedure Laterality Date   BRAIN SURGERY      Allergies  No Known Allergies  History of Present Illness    [name] has a PMH of hypertensive heart disease with acute diastolic CHF, pulmonary edema, polysubstance abuse, mesenteric artery stenosis, CKD stage III and elevated creatinine.  He was admitted 12/18 with worsening shortness of breath.  He reported that he was on his way to get his inhaler and lost consciousness.  He was found by a bystander who contacted EMS.  EMS gave him Narcan in the field due to concern with opiate overdose.  On arrival to the emergency department he was severely hypertensive 211/42.  His UDS was positive for cocaine.  He is troponin was elevated at 0.08.  His BNP was 204.1.  His hemoglobin was normal.  His potassium was 3.4.  He was treated for hypertensive urgency and was not a candidate for beta blocking medication due to his cocaine use.  He was also diagnosed with acute pulmonary edema and treated with IV diuresis.  His echocardiogram 02/25/2017 showed an EF of 50-55%, G1 DD, akinesis of the mid to apical inferior septal myocardium, mild aortic insufficiency, mild dilated left and right atrium.  Due to his lack of anginal symptoms no ischemic evaluation was completed.  His renal Dopplers showed less than 60% renal artery stenosis bilaterally.  He followed up with Dr. Swaziland 5/19 and was doing well at that time.  His furosemide was was discontinued because he  was euvolemic.  He denied chest discomfort and shortness of breath.  He had returned to his baseline functional ability.  He denied any abdominal pain related to his severe superior mesenteric artery disease.  He denied nausea vomiting.  He followed up with Azalee Course, PA-C 12/30/2018 virtually.  He continued to deny chest discomfort.  He denied postprandial abdominal pain, nausea vomiting.  Who presents to the clinic today for follow-up evaluation states he feels well.  He does note occasional trouble with his breathing while working in his dusty work environment.  He reports that he has been wearing a mask which does help somewhat.  He has been following a heart healthy diet and has remained physically active.  He does yard work and walks in his neighborhood for about half hour 3 times per week.  He reports that he has not used illicit drugs since 2019.  I will order a BMP, CBC, lipids, give him a salty 6 diet sheet, have him maintain his physical activity, have instructed him to use Zyrtec/Flonase as needed, and we will have him follow-up in 12 months.  Today denies chest pain, shortness of breath, lower extremity edema, fatigue, palpitations, melena, hematuria, hemoptysis, diaphoresis, weakness, presyncope, syncope, orthopnea, and PND.   Home Medications    Prior to Admission medications   Medication Sig Start Date End Date Taking? Authorizing Provider  amLODipine (NORVASC) 10 MG tablet TAKE 1 TABLET (10 MG TOTAL) BY MOUTH DAILY. 01/08/20 01/07/21  Swaziland, Peter  M, MD  aspirin EC 81 MG tablet Take 1 tablet (81 mg total) by mouth daily. 07/31/17   [place]il[place], Peter M, MD  hydrALAZINE (APRESOLINE) 100 MG tablet Take 1 tablet (100 mg total) by mouth every 8 (eight) hours. 08/11/20   [place], Peter M, MD  isosorbide mononitrate (IMDUR) 60 MG 24 hr tablet Take 1 tablet (60 mg total) by mouth daily. PLEASE KEEP OFFICE APPOINTMENT FOR FUTURE REFILLS 08/11/20   Sw[place]iland, Peter M, MD  labetalol (NORMODYNE) 300 MG  tablet TAKE 1 TABLET (300 MG TOTAL) BY MOUTH 2 (TWO) TIMES DAILY. NEED OFFICE VISIT 08/11/20   Swaziland, Peter M, MD  Misc. Devices MISC CPAP    [provider]  nicotine (NICODERM CQ - DOSED IN MG/24 HOURS) 14 mg/24hr patch Place 1 patch (14 mg total) onto the skin daily. FILL RX AFTER 21MG  PATCH Patient not taking: Reported on 12/30/2018 12/20/17   02/19/18, PA  nicotine (NICODERM CQ - DOSED IN MG/24 HOURS) 21 mg/24hr patch Place 1 patch (21 mg total) onto the skin daily. Fill this rx first Patient not taking: Reported on 12/30/2018 12/20/17   02/19/18, PA  nicotine (NICODERM CQ - DOSED IN MG/24 HR) 7 mg/24hr patch Place 1 patch (7 mg total) onto the skin daily. Patient not taking: Reported on 12/30/2018 12/20/17   02/19/18, PA  spironolactone (ALDACTONE) 25 MG tablet Take 1 tablet (25 mg total) by mouth daily. 08/11/20   10/11/20, Peter M, MD    Family History    Family History  Problem Relation Age of Onset   Hypertension Mother    CAD Brother 3   Coronary artery disease Brother    CAD Brother    He indicated that his mother is deceased. He indicated that his father is deceased. He indicated that both of his brothers are deceased. He indicated that his maternal grandmother is deceased. He indicated that his maternal grandfather is deceased. He indicated that his paternal grandmother is deceased. He indicated that his paternal grandfather is deceased.  Social History    Social History   Socioeconomic History   Marital status: Married    Spouse name: Not on file   Number of children: Not on file   Years of education: Not on file   Highest education level: Not on file  Occupational History   Not on file  Tobacco Use   Smoking status: Heavy Smoker    Packs/day: 1.00    Types: Cigars, Cigarettes   Smokeless tobacco: Never  Vaping Use   Vaping Use: Never used  Substance and Sexual Activity   Alcohol use: Yes   Drug use: Yes    Types: Cocaine   Sexual activity: Not on  file  Other Topics Concern   Not on file  Social History Narrative   Not on file   Social Determinants of Health   Financial Resource Strain: Not on file  Food Insecurity: Not on file  Transportation Needs: Not on file  Physical Activity: Not on file  Stress: Not on file  Social Connections: Not on file  Intimate Partner Violence: Not on file     Review of Systems    General:  No chills, fever, night sweats or weight changes.  Cardiovascular:  No chest pain, dyspnea on exertion, edema, orthopnea, palpitations, paroxysmal nocturnal dyspnea. Dermatological: No rash, lesions/masses Respiratory: No cough, dyspnea Urologic: No hematuria, dysuria Abdominal:   No nausea, vomiting, diarrhea, bright red blood per rectum, melena, or hematemesis Neurologic:  No visual changes, wkns, changes in mental status. All other systems reviewed and are otherwise negative except as noted above.  Physical Exam    VS:  BP 130/82   Pulse 60   Resp 20   Ht 6' (1.829 m)   Wt 232 lb 3.2 oz (105.3 kg)   SpO2 99%   BMI 31.49 kg/m  , BMI Body mass index is 31.49 kg/m. GEN: Well nourished, well developed, in no acute distress. HEENT: normal. Neck: Supple, no JVD, carotid bruits, or masses. Cardiac: RRR, no murmurs, rubs, or gallops. No clubbing, cyanosis, edema.  Radials/DP/PT 2+ and equal bilaterally.  Respiratory:  Respirations regular and unlabored, clear to auscultation bilaterally. GI: Soft, nontender, nondistended, BS + x 4. MS: no deformity or atrophy. Skin: warm and dry, no rash. Neuro:  Strength and sensation are intact. Psych: Normal affect.  Accessory Clinical Findings    Recent Labs: No results found for requested labs within last 8760 hours.   Recent Lipid Panel    Component Value Date/Time   CHOL 186 02/25/2017 0040   TRIG 76 02/25/2017 0040   HDL 40 (L) 02/25/2017 0040   CHOLHDL 4.7 02/25/2017 0040   VLDL 15 02/25/2017 0040   LDLCALC 131 (H) 02/25/2017 0040    ECG  personally reviewed by me today-normal sinus rhythm early repolarization 60 bpm- No acute changes  Echocardiogram 02/25/2017 Study Conclusions   - Left ventricle: The cavity size was normal. There was mild    concentric hypertrophy. Systolic function was normal. The    estimated ejection fraction was in the range of 50% to 55%. There    is akinesis of the mid-apicalinferoseptal myocardium. Doppler    parameters are consistent with abnormal left ventricular    relaxation (grade 1 diastolic dysfunction).  - Aortic valve: There was mild regurgitation.  - Left atrium: The atrium was mildly dilated.  - Right atrium: The atrium was mildly dilated.  - Tricuspid valve: There was trivial regurgitation.   Assessment & Plan   1.  Essential hypertension-BP today 130/82.  Unable to obtain blood pressure at home.  Denies headaches, presyncope, syncope, vision changes.  Neurologically intact.  Reports compliance with his medications.  Expressed importance of medication compliance and CVA prevention. Continue amlodipine, hydralazine, Imdur, labetalol, spironolactone Heart healthy low-sodium diet-salty 6 given Increase physical activity as tolerated Maintain blood pressure log Order BMP, CBC, fasting lipids  Abnormal echocardiogram-no increased DOE or activity intolerance.  Noted to have bed-apical inferior septal myocardial akinesis 12/18. No plans for  ischemic evaluation  Polysubstance abuse-denies recent use.  Previously noted to be positive for cocaine, alcohol, and tobacco.  Not a candidate for beta-blocker.  Mesenteric artery stenosis-continues to be symptom-free.  Denies abdominal pain, nausea, and vomiting.  Reports regular bowel movements.  Disposition: Follow-up with Dr. Swaziland in 1 year.  Thomasene Ripple. Hiram Mciver NP-C    10/20/2020, 8:37 AM [facility] [address] Office [phone] Fax [phone]  Notice: This dictation was prepared with Dragon  dictation along with smaller phrase technology. Any transcriptional errors that result from this process are unintentional and may not be corrected upon review.  I spent 15 minutes examining this patient, reviewing medications, and using patient centered shared decision making involving her cardiac care.  Prior to her visit I spent greater than 20 minutes reviewing her past medical history,  medications, and prior cardiac tests.

## 2020-10-20 ENCOUNTER — Ambulatory Visit (INDEPENDENT_AMBULATORY_CARE_PROVIDER_SITE_OTHER): Payer: Self-pay | Admitting: General Practice

## 2020-10-20 ENCOUNTER — Other Ambulatory Visit: Payer: Self-pay

## 2020-10-20 ENCOUNTER — Encounter: Payer: Self-pay | Admitting: General Practice

## 2020-10-20 VITALS — BP 130/82 | HR 60 | Resp 20 | Ht 72.0 in | Wt 232.2 lb

## 2020-10-20 DIAGNOSIS — Z79899 Other long term (current) drug therapy: Secondary | ICD-10-CM

## 2020-10-20 DIAGNOSIS — I10 Essential (primary) hypertension: Secondary | ICD-10-CM

## 2020-10-20 DIAGNOSIS — K551 Chronic vascular disorders of intestine: Secondary | ICD-10-CM

## 2020-10-20 DIAGNOSIS — F1910 Other psychoactive substance abuse, uncomplicated: Secondary | ICD-10-CM

## 2020-10-20 DIAGNOSIS — R931 Abnormal findings on diagnostic imaging of heart and coronary circulation: Secondary | ICD-10-CM

## 2020-10-20 LAB — CBC
Hematocrit: 43.6 % (ref 37.5–51.0)
Hemoglobin: 14.6 g/dL (ref 13.0–17.7)
MCH: 30.7 pg (ref 26.6–33.0)
MCHC: 33.5 g/dL (ref 31.5–35.7)
MCV: 92 fL (ref 79–97)
Platelets: 267 10*3/uL (ref 150–450)
RBC: 4.75 x10E6/uL (ref 4.14–5.80)
RDW: 13.3 % (ref 11.6–15.4)
WBC: 6 10*3/uL (ref 3.4–10.8)

## 2020-10-20 LAB — BASIC METABOLIC PANEL
BUN/Creatinine Ratio: 12 (ref 9–20)
BUN: 15 mg/dL (ref 6–24)
CO2: 23 mmol/L (ref 20–29)
Calcium: 8.8 mg/dL (ref 8.7–10.2)
Chloride: 104 mmol/L (ref 96–106)
Creatinine, Ser: 1.29 mg/dL — ABNORMAL HIGH (ref 0.76–1.27)
Glucose: 93 mg/dL (ref 65–99)
Potassium: 5.2 mmol/L (ref 3.5–5.2)
Sodium: 141 mmol/L (ref 134–144)
eGFR: 66 mL/min/{1.73_m2} (ref >59)

## 2020-10-20 MED ORDER — SPIRONOLACTONE 25 MG PO TABS
25.0000 mg | ORAL_TABLET | Freq: Every day | ORAL | 12 refills | Status: DC
  Filled 2020-10-20: qty 30, 30d supply, fill #0
  Filled 2020-11-23: qty 30, 30d supply, fill #1
  Filled 2020-12-29: qty 30, 30d supply, fill #2
  Filled 2021-02-06: qty 30, 30d supply, fill #3
  Filled 2021-03-14 – 2021-03-19 (×2): qty 30, 30d supply, fill #4
  Filled 2021-03-20: qty 30, 30d supply, fill #0
  Filled 2021-04-21: qty 30, 30d supply, fill #1
  Filled 2021-05-23: qty 30, 30d supply, fill #2
  Filled 2021-06-23: qty 30, 30d supply, fill #3
  Filled 2021-07-31: qty 30, 30d supply, fill #4
  Filled 2021-09-03: qty 30, 30d supply, fill #5
  Filled 2021-10-11: qty 30, 30d supply, fill #6

## 2020-10-20 MED ORDER — HYDRALAZINE HCL 100 MG PO TABS
100.0000 mg | ORAL_TABLET | Freq: Three times a day (TID) | ORAL | 12 refills | Status: DC
  Filled 2020-10-20 – 2020-11-23 (×2): qty 30, 10d supply, fill #0
  Filled 2020-12-09: qty 30, 10d supply, fill #1
  Filled 2020-12-29: qty 90, 30d supply, fill #2
  Filled 2020-12-29: qty 30, 10d supply, fill #2
  Filled 2021-02-20: qty 90, 30d supply, fill #3
  Filled 2021-04-12: qty 90, 30d supply, fill #0
  Filled 2021-04-12: qty 90, 30d supply, fill #4
  Filled 2021-05-28: qty 60, 20d supply, fill #1

## 2020-10-20 MED ORDER — ISOSORBIDE MONONITRATE ER 60 MG PO TB24
60.0000 mg | ORAL_TABLET | Freq: Every day | ORAL | 12 refills | Status: DC
  Filled 2020-10-20: qty 30, 30d supply, fill #0
  Filled 2020-11-23: qty 30, 30d supply, fill #1
  Filled 2020-12-29: qty 30, 30d supply, fill #2
  Filled 2021-02-06: qty 30, 30d supply, fill #3
  Filled 2021-03-14 – 2021-03-19 (×2): qty 30, 30d supply, fill #4
  Filled 2021-03-20: qty 30, 30d supply, fill #0
  Filled 2021-04-21: qty 30, 30d supply, fill #1
  Filled 2021-05-23: qty 30, 30d supply, fill #2
  Filled 2021-06-23: qty 30, 30d supply, fill #3
  Filled 2021-07-31: qty 30, 30d supply, fill #4
  Filled 2021-09-03: qty 30, 30d supply, fill #5
  Filled 2021-10-11: qty 30, 30d supply, fill #6

## 2020-10-20 NOTE — Patient Instructions (Signed)
Medication Instructions:  May use Flonase and any new over-the-counter allergy medications Xyzal, zyrtec...  *If you need a refill on your cardiac medications before your next appointment, please call your pharmacy*  Lab Work: CBC,BMET AND FASTIN LIPID If you have labs (blood work) drawn today and your tests are completely normal, you will receive your results only by:  MyChart Message (if you have MyChart) OR A paper copy in the mail.  If you have any lab test that is abnormal or we need to change your treatment, we will call you to review the results. You may go to any [facility] that is convenient for you however, we do have a lab in our office that is able to assist you. You DO NOT need an appointment for our lab. The lab is open 8:00am and closes at 4:00pm. Lunch 12:45 - 1:45pm.  Special Instructions  PLEASE READ AND FOLLOW SALTY 6-ATTACHED-1,800 mg daily  PLEASE MAINTAIN PHYSICAL ACTIVITY AS TOLERATED  Follow-Up: Your next appointment:  12 month(s) In Person with Peter Swaziland, MD ONLY  Please call our office 3 months in advance to schedule this appointment   At [facility], you and your health needs are our priority.  As part of our continuing mission to provide you with exceptional heart care, we have created designated Provider Care Teams.  These Care Teams include your primary Cardiologist (physician) and Advanced Practice Providers (APPs -  Physician Assistants and Nurse Practitioners) who all work together to provide you with the care you need, when you need it.  We recommend signing up for the patient portal called "MyChart".  Sign up information is provided on this After Visit Summary.  MyChart is used to connect with patients for Virtual Visits (Telemedicine).  Patients are able to view lab/test results, encounter notes, upcoming appointments, etc.  Non-urgent messages can be sent to your provider as well.   To learn more about what you can do with MyChart, go to  [email or web address].

## 2020-11-23 ENCOUNTER — Other Ambulatory Visit: Payer: Self-pay

## 2020-12-07 ENCOUNTER — Other Ambulatory Visit: Payer: Self-pay | Admitting: Cardiology

## 2020-12-07 ENCOUNTER — Other Ambulatory Visit: Payer: Self-pay

## 2020-12-07 MED ORDER — LABETALOL HCL 300 MG PO TABS
300.0000 mg | ORAL_TABLET | Freq: Two times a day (BID) | ORAL | 11 refills | Status: DC
  Filled 2020-12-07: qty 60, 30d supply, fill #0
  Filled 2021-02-06: qty 60, 30d supply, fill #1
  Filled 2021-04-12: qty 60, 30d supply, fill #0
  Filled 2021-04-12: qty 60, 30d supply, fill #2
  Filled 2021-06-07: qty 60, 30d supply, fill #1
  Filled 2021-07-31: qty 60, 30d supply, fill #2
  Filled 2021-09-28: qty 60, 30d supply, fill #3
  Filled 2021-11-26: qty 60, 30d supply, fill #4

## 2020-12-09 ENCOUNTER — Other Ambulatory Visit: Payer: Self-pay

## 2020-12-29 ENCOUNTER — Other Ambulatory Visit: Payer: Self-pay

## 2021-02-07 ENCOUNTER — Other Ambulatory Visit: Payer: Self-pay

## 2021-02-20 ENCOUNTER — Other Ambulatory Visit: Payer: Self-pay

## 2021-03-15 ENCOUNTER — Other Ambulatory Visit: Payer: Self-pay

## 2021-03-20 ENCOUNTER — Other Ambulatory Visit: Payer: Self-pay

## 2021-04-12 ENCOUNTER — Other Ambulatory Visit: Payer: Self-pay

## 2021-04-21 ENCOUNTER — Other Ambulatory Visit: Payer: Self-pay

## 2021-05-23 ENCOUNTER — Other Ambulatory Visit: Payer: Self-pay

## 2021-05-29 ENCOUNTER — Other Ambulatory Visit: Payer: Self-pay

## 2021-06-07 ENCOUNTER — Other Ambulatory Visit: Payer: Self-pay

## 2021-06-23 ENCOUNTER — Other Ambulatory Visit: Payer: Self-pay

## 2021-07-02 ENCOUNTER — Other Ambulatory Visit: Payer: Self-pay | Admitting: General Practice

## 2021-07-03 ENCOUNTER — Other Ambulatory Visit: Payer: Self-pay

## 2021-07-03 MED ORDER — HYDRALAZINE HCL 100 MG PO TABS
100.0000 mg | ORAL_TABLET | Freq: Three times a day (TID) | ORAL | 3 refills | Status: DC
  Filled 2021-07-03: qty 90, 30d supply, fill #0
  Filled 2021-08-29: qty 90, 30d supply, fill #1
  Filled 2021-10-22: qty 90, 30d supply, fill #2
  Filled 2021-12-17: qty 90, 30d supply, fill #3

## 2021-07-04 ENCOUNTER — Other Ambulatory Visit: Payer: Self-pay

## 2021-07-31 ENCOUNTER — Other Ambulatory Visit: Payer: Self-pay

## 2021-08-29 ENCOUNTER — Other Ambulatory Visit: Payer: Self-pay

## 2021-08-30 ENCOUNTER — Other Ambulatory Visit: Payer: Self-pay

## 2021-09-04 ENCOUNTER — Other Ambulatory Visit: Payer: Self-pay

## 2021-09-28 ENCOUNTER — Other Ambulatory Visit: Payer: Self-pay

## 2021-10-11 ENCOUNTER — Other Ambulatory Visit: Payer: Self-pay

## 2021-10-23 ENCOUNTER — Other Ambulatory Visit: Payer: Self-pay

## 2021-10-24 ENCOUNTER — Other Ambulatory Visit: Payer: Self-pay

## 2021-11-19 ENCOUNTER — Other Ambulatory Visit: Payer: Self-pay | Admitting: General Practice

## 2021-11-20 ENCOUNTER — Other Ambulatory Visit: Payer: Self-pay

## 2021-11-20 MED ORDER — SPIRONOLACTONE 25 MG PO TABS
25.0000 mg | ORAL_TABLET | Freq: Every day | ORAL | 0 refills | Status: DC
  Filled 2021-11-20: qty 90, 90d supply, fill #0

## 2021-11-20 MED ORDER — ISOSORBIDE MONONITRATE ER 60 MG PO TB24
60.0000 mg | ORAL_TABLET | Freq: Every day | ORAL | 0 refills | Status: DC
  Filled 2021-11-20: qty 90, 90d supply, fill #0

## 2021-11-21 ENCOUNTER — Other Ambulatory Visit: Payer: Self-pay

## 2021-11-27 ENCOUNTER — Other Ambulatory Visit: Payer: Self-pay

## 2021-12-18 ENCOUNTER — Other Ambulatory Visit: Payer: Self-pay

## 2021-12-19 ENCOUNTER — Other Ambulatory Visit: Payer: Self-pay

## 2022-02-23 NOTE — Progress Notes (Unsigned)
Cardiology Office Note:    Date:  02/26/2022   ID:  Alex Liu, DOB 1965-12-09, MRN [id number]  PCP:  Alex Liu   [facility] Providers Cardiologist:  Peter Martinique, MD     Referring MD: No ref. provider found   Chief Complaint: follow-up hypertension  History of Present Illness:    Alex Liu is a pleasant 56 y.o. 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  Admitted 02/2017 with worsening shortness of breath.  Reported he was on his way to get his inhaler and lost consciousness, was found by bystander who contacted EMS.  EMS gave him Narcan in the field due to concern with opiate overdose.  On arrival to ED he was severely hypertensive 211/42.  His UDS was positive for cocaine. Hs troponin was elevated at 0.08.  BNP was 204.1, hemoglobin was normal, potassium was 3.4.  He was treated for hypertensive urgency and was not a candidate for beta blocking medication due to cocaine use.  He was diagnosed with acute pulmonary edema and treated with IV diuresis.  His echocardiogram 02/25/2017 showed an EF of 50 to 55%, G1 DD, akinesis of the mid to apical inferior septal myocardium, mild aortic insufficiency, mild dilated left and right atrium.  Due to his lack of anginal symptoms no ischemic evaluation was completed.  His renal Doppler showed less than 60% renal artery stenosis bilaterally.  He followed up with Dr. Martinique 07/2017 and was doing well at that time.  His furosemide was discontinued because he was euvolemic.  He denied chest discomfort and shortness of breath and had returned to his baseline functional ability.  He denied abdominal pain related to his severe superior mesenteric artery disease.  Telehealth visit with [name], PA on 12/30/2018 he continued to deny chest discomfort.  He denied postprandial abdominal pain, N/V.  Seen by [name], NP on 10/20/2020.  He noted occasional trouble with breathing while working in a dusty work environment.  Was wearing a mask which helps somewhat.  He remained active doing yard work and walking in his neighborhood for about half an hour 3 times per week. Reported no illicit drug use since 2019. No changes were made to treatment regimen and he was advised to follow-up in 1 year.  Today, he is here alone for follow-up. Reports he is feeling well and has no specific cardiac concerns at this time. 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 BP well controlled at home, was rushing to get here this morning. Recently ran out of his medications, did not realize it had been more than 1 year since last ov. Is following a low sodium diet, avoiding process foods and saturated fat. He denies chest pain, shortness of breath, lower extremity edema, fatigue, palpitations, melena, hematuria, hemoptysis, diaphoresis, weakness, presyncope, syncope, orthopnea, and PND. No concerning symptoms over the previous year. Continues to smoke 1 cigar daily.   Past Medical History:  Diagnosis Date   Hypertension    Hypertensive heart disease with acute diastolic congestive heart failure (HCC)     Past Surgical History:  Procedure Laterality Date   BRAIN SURGERY      Current Medications: Current Meds  Medication Sig   aspirin EC 81 MG tablet Take 1 tablet (81 mg total) by mouth daily.   Misc. Devices MISC CPAP  nicotine (NICODERM CQ - DOSED IN MG/24 HOURS) 14 mg/24hr patch Place 1 patch (14 mg total) onto the skin daily. FILL RX AFTER 21MG PATCH   nicotine (NICODERM CQ - DOSED IN MG/24 HOURS) 21 mg/24hr patch Place 1 patch (21 mg total) onto the skin daily. Fill this rx first   nicotine (NICODERM CQ - DOSED IN MG/24 HR) 7 mg/24hr patch Place 1 patch (7 mg total) onto the skin daily.   [DISCONTINUED] hydrALAZINE (APRESOLINE) 100 MG tablet Take 1 tablet (100 mg total) by mouth every 8  (eight) hours.   [DISCONTINUED] isosorbide mononitrate (IMDUR) 60 MG 24 hr tablet Take 1 tablet (60 mg total) by mouth daily. PLEASE KEEP OFFICE APPOINTMENT FOR FUTURE REFILLS   [DISCONTINUED] labetalol (NORMODYNE) 300 MG tablet Take 1 tablet (300 mg total) by mouth 2 (two) times daily.   [DISCONTINUED] spironolactone (ALDACTONE) 25 MG tablet Take 1 tablet (25 mg total) by mouth daily.     Allergies:   Patient has no known allergies.   Social History   Socioeconomic History   Marital status: Married    Spouse name: Not on file   Number of children: Not on file   Years of education: Not on file   Highest education level: Not on file  Occupational History   Not on file  Tobacco Use   Smoking status: Heavy Smoker    Packs/day: 1.00    Types: Cigars, Cigarettes   Smokeless tobacco: Never  Vaping Use   Vaping Use: Never used  Substance and Sexual Activity   Alcohol use: Yes   Drug use: Yes    Types: Cocaine   Sexual activity: Not on file  Other Topics Concern   Not on file  Social History Narrative   Not on file   Social Determinants of Health   Financial Resource Strain: Not on file  Food Insecurity: Not on file  Transportation Needs: Not on file  Physical Activity: Not on file  Stress: Not on file  Social Connections: Not on file     Family History: The patient's family history includes CAD in his brother; CAD (age of onset: 57) in his brother; Coronary artery disease in his brother; Hypertension in his mother.  ROS:   Please see the history of present illness.   All other systems reviewed and are negative.  Labs/Other Studies Reviewed:    The following studies were reviewed today:  Renal Artery [place] 03/01/17 Renal:    Right: Less than 60% stenosis of the right renal artery.  Left:  Less than 60% stenosis of the left renal artery.  Mesenteric:  Velocities suggest 70 to 99% stenosis in the superior mesenteric artery,  however  visualization of the SMA was not  optimal.  Echo 02/25/17 Left ventricle: The cavity size was normal. There was mild    concentric hypertrophy. Systolic function was normal. The    estimated ejection fraction was in the range of 50% to 55%. There    is akinesis of the mid-apicalinferoseptal myocardium. Doppler    parameters are consistent with abnormal left ventricular    relaxation (grade 1 diastolic dysfunction).  - Aortic valve: There was mild regurgitation.  - Left atrium: The atrium was mildly dilated.  - Right atrium: The atrium was mildly dilated.  - Tricuspid valve: There was trivial regurgitation.   Recent Labs: No results found for requested labs within last 365 days.  Recent Lipid Panel    Component Value Date/Time   CHOL 186 02/25/2017 0040  TRIG 76 02/25/2017 0040   HDL 40 (L) 02/25/2017 0040   CHOLHDL 4.7 02/25/2017 0040   VLDL 15 02/25/2017 0040   LDLCALC 131 (H) 02/25/2017 0040     Risk Assessment/Calculations:       Physical Exam:    VS:  BP (!) 140/88   Pulse 74   Ht 6' (1.829 m)   Wt 233 lb 3.2 oz (105.8 kg)   SpO2 96%   BMI 31.63 kg/m     Wt Readings from Last 3 Encounters:  02/26/22 233 lb 3.2 oz (105.8 kg)  10/20/20 232 lb 3.2 oz (105.3 kg)  12/30/18 239 lb (108.4 kg)     GEN:  Well nourished, well developed in no acute distress HEENT: Normal NECK: No JVD; No carotid bruits CARDIAC: RRR, no murmurs, rubs, gallops RESPIRATORY:  Clear to auscultation without rales, wheezing or rhonchi  ABDOMEN: Soft, non-tender, non-distended MUSCULOSKELETAL:  No edema; No deformity. 2+ pedal pulses, equal bilaterally SKIN: Warm and dry NEUROLOGIC:  Alert and oriented x 3 PSYCHIATRIC:  Normal affect   EKG:  EKG is ordered today.  The ekg ordered today demonstrates normal sinus rhythm at 74 bpm, TWI lead III (new)   HYPERTENSION CONTROL Vitals:   02/26/22 0903 02/26/22 0919  BP: (!) 144/88 (!) 140/88    The patient's blood pressure is elevated above target today.  In order to  address the patient's elevated BP: Blood pressure will be monitored at home to determine if medication changes need to be made. (ran out of anti-hypertensives)      Diagnoses:    1. Essential hypertension   2. Chronic combined systolic and diastolic heart failure ([place])   3. T wave inversion in EKG   4. Hypertensive heart disease with acute diastolic congestive heart failure ([place])   5. Hyperlipidemia, unspecified hyperlipidemia type    Assessment and Plan:     T wave inversion on EKG: He has a new TWI in lead III on EKG. He denies chest pain, dyspnea, or other symptoms concerning for angina. No symptoms for the last year that he can recall. Will get echo to evaluate for wall motion abnormality, heart and valve function. BP is elevated today.   Hypertensive heart disease: BP is elevated and remains elevated on my recheck. Reports home BP readings consistently < 140/80. Has been out of his antihypertensives for a few days. No problems with medications, takes them consistently just ran out without realizing no refills were available. No medication changes today, refills have been provided.   Chronic combined CHF: LVEF 50-55%, G1DD, mild LVH, akinesis of mid-apicalinferoseptal myocardium on echo 02/2017.  Denies dyspnea, orthopnea, PND, edema. Will get updated echocardiogram for evaluation of heart and valve function. Appears euvolemic on exam.  Continue hydralazine, labetalol, spironolactone, isosorbide.  Hyperlipidemia: No recent lipid panel to review. Will check lipids and liver function today.   Tobacco abuse: Continues to smoke 1 cigar daily which is a decrease from previous usage. Complete cessation advised.     Disposition: 6 months with Dr. Martinique  Medication Adjustments/Labs and Tests Ordered: Current medicines are reviewed at length with the patient today.  Concerns regarding medicines are outlined above.  Orders Placed This Encounter  Procedures   Lipid Profile   Comp Met  (CMET)   EKG 12-Lead   ECHOCARDIOGRAM COMPLETE   Meds ordered this encounter  Medications   hydrALAZINE (APRESOLINE) 100 MG tablet    Sig: Take 1 tablet (100 mg total) by mouth every 8 (eight)  hours.    Dispense:  90 tablet    Refill:  3   isosorbide mononitrate (IMDUR) 60 MG 24 hr tablet    Sig: Take 1 tablet (60 mg total) by mouth daily.    Dispense:  90 tablet    Refill:  3   labetalol (NORMODYNE) 300 MG tablet    Sig: Take 1 tablet (300 mg total) by mouth 2 (two) times daily.    Dispense:  180 tablet    Refill:  3   spironolactone (ALDACTONE) 25 MG tablet    Sig: Take 1 tablet (25 mg total) by mouth daily.    Dispense:  90 tablet    Refill:  3    Patient Instructions  Medication Instructions:   Your physician recommends that you continue on your current medications as directed. Please refer to the Current Medication list given to you today.   *If you need a refill on your cardiac medications before your next appointment, please call your pharmacy*   Lab Work:  TODAY!!!! CMET/LIPID !!!  If you have labs (blood work) drawn today and your tests are completely normal, you will receive your results only by: [place] (if you have MyChart) OR A paper copy in the mail If you have any lab test that is abnormal or we need to change your treatment, we will call you to review the results.   Testing/Procedures:  Your physician has requested that you have an echocardiogram. Echocardiography is a painless test that uses sound waves to create images of your heart. It provides your doctor with information about the size and shape of your heart and how well your heart's chambers and valves are working. This procedure takes approximately one hour. There are no restrictions for this procedure. Please do NOT wear cologne, perfume, aftershave, or lotions (deodorant is allowed). Please arrive 15 minutes prior to your appointment time.    Follow-Up: At [facility],  you and your health needs are our priority.  As part of our continuing mission to provide you with exceptional heart care, we have created designated Provider Care Teams.  These Care Teams include your primary Cardiologist (physician) and Advanced Practice Providers (APPs -  Physician Assistants and Nurse Practitioners) who all work together to provide you with the care you need, when you need it.  We recommend signing up for the patient portal called "MyChart".  Sign up information is provided on this After Visit Summary.  MyChart is used to connect with patients for Virtual Visits (Telemedicine).  Patients are able to view lab/test results, encounter notes, upcoming appointments, etc.  Non-urgent messages can be sent to your provider as well.   To learn more about what you can do with MyChart, go to [email or web address].    Your next appointment:   6 month(s)  The format for your next appointment:   In Person  Provider:   Peter Martinique, MD     Important Information About Sugar         Signed, Emmaline Life, NP  02/26/2022 12:25 PM    [place]

## 2022-02-26 ENCOUNTER — Encounter: Payer: Self-pay | Admitting: Nurse Practitioner

## 2022-02-26 ENCOUNTER — Ambulatory Visit: Payer: Self-pay | Attending: Nurse Practitioner | Admitting: Nurse Practitioner

## 2022-02-26 ENCOUNTER — Other Ambulatory Visit: Payer: Self-pay

## 2022-02-26 VITALS — BP 140/88 | HR 74 | Ht 72.0 in | Wt 233.2 lb

## 2022-02-26 DIAGNOSIS — I5042 Chronic combined systolic (congestive) and diastolic (congestive) heart failure: Secondary | ICD-10-CM

## 2022-02-26 DIAGNOSIS — I5031 Acute diastolic (congestive) heart failure: Secondary | ICD-10-CM

## 2022-02-26 DIAGNOSIS — I10 Essential (primary) hypertension: Secondary | ICD-10-CM

## 2022-02-26 DIAGNOSIS — Z720 Tobacco use: Secondary | ICD-10-CM

## 2022-02-26 DIAGNOSIS — E785 Hyperlipidemia, unspecified: Secondary | ICD-10-CM

## 2022-02-26 DIAGNOSIS — R9431 Abnormal electrocardiogram [ECG] [EKG]: Secondary | ICD-10-CM

## 2022-02-26 DIAGNOSIS — I110 Hypertensive heart disease with heart failure: Secondary | ICD-10-CM

## 2022-02-26 LAB — COMPREHENSIVE METABOLIC PANEL WITH GFR
ALT: 16 IU/L (ref 0–44)
AST: 15 IU/L (ref 0–40)
Albumin/Globulin Ratio: 2.1 (ref 1.2–2.2)
Albumin: 4.1 g/dL (ref 3.8–4.9)
Alkaline Phosphatase: 95 IU/L (ref 44–121)
BUN/Creatinine Ratio: 11 (ref 9–20)
BUN: 12 mg/dL (ref 6–24)
Bilirubin Total: <0.2 mg/dL (ref 0.0–1.2)
CO2: 25 mmol/L (ref 20–29)
Calcium: 9.3 mg/dL (ref 8.7–10.2)
Chloride: 103 mmol/L (ref 96–106)
Creatinine, Ser: 1.14 mg/dL (ref 0.76–1.27)
Globulin, Total: 2 g/dL (ref 1.5–4.5)
Glucose: 91 mg/dL (ref 70–99)
Potassium: 4.5 mmol/L (ref 3.5–5.2)
Sodium: 139 mmol/L (ref 134–144)
Total Protein: 6.1 g/dL (ref 6.0–8.5)
eGFR: 76 mL/min/1.73

## 2022-02-26 LAB — LIPID PANEL
Chol/HDL Ratio: 4 ratio (ref 0.0–5.0)
Cholesterol, Total: 180 mg/dL (ref 100–199)
HDL: 45 mg/dL (ref >39)
LDL Chol Calc (NIH): 112 mg/dL — ABNORMAL HIGH (ref 0–99)
Triglycerides: 127 mg/dL (ref 0–149)
VLDL Cholesterol Cal: 23 mg/dL (ref 5–40)

## 2022-02-26 MED ORDER — HYDRALAZINE HCL 100 MG PO TABS
100.0000 mg | ORAL_TABLET | Freq: Three times a day (TID) | ORAL | 3 refills | Status: DC
  Filled 2022-02-26: qty 90, 30d supply, fill #0
  Filled 2022-04-22: qty 90, 30d supply, fill #1
  Filled 2022-06-22: qty 90, 30d supply, fill #2
  Filled 2022-08-31: qty 90, 30d supply, fill #3

## 2022-02-26 MED ORDER — LABETALOL HCL 300 MG PO TABS
300.0000 mg | ORAL_TABLET | Freq: Two times a day (BID) | ORAL | 3 refills | Status: DC
  Filled 2022-02-26: qty 60, 30d supply, fill #0
  Filled 2022-04-22 – 2022-04-23 (×2): qty 60, 30d supply, fill #1
  Filled 2022-06-22: qty 60, 30d supply, fill #2
  Filled 2022-08-31: qty 60, 30d supply, fill #3
  Filled 2022-11-02: qty 60, 30d supply, fill #4
  Filled 2023-01-15: qty 60, 30d supply, fill #5
  Filled 2023-02-25 (×2): qty 60, 30d supply, fill #6

## 2022-02-26 MED ORDER — ISOSORBIDE MONONITRATE ER 60 MG PO TB24
60.0000 mg | ORAL_TABLET | Freq: Every day | ORAL | 3 refills | Status: DC
  Filled 2022-02-26: qty 30, 30d supply, fill #0
  Filled 2022-04-22: qty 30, 30d supply, fill #1
  Filled 2022-05-28: qty 30, 30d supply, fill #2
  Filled 2022-07-05: qty 30, 30d supply, fill #3
  Filled 2023-02-25: qty 30, 30d supply, fill #4

## 2022-02-26 MED ORDER — SPIRONOLACTONE 25 MG PO TABS
25.0000 mg | ORAL_TABLET | Freq: Every day | ORAL | 3 refills | Status: DC
  Filled 2022-02-26: qty 30, 30d supply, fill #0
  Filled 2022-04-22 – 2022-04-23 (×2): qty 30, 30d supply, fill #1
  Filled 2022-05-28: qty 30, 30d supply, fill #2
  Filled 2022-07-05: qty 30, 30d supply, fill #3
  Filled 2022-08-16: qty 30, 30d supply, fill #4
  Filled 2022-09-27: qty 30, 30d supply, fill #5
  Filled 2022-11-02: qty 30, 30d supply, fill #6
  Filled 2022-12-07: qty 30, 30d supply, fill #7
  Filled 2023-01-15: qty 30, 30d supply, fill #8
  Filled 2023-02-15: qty 30, 30d supply, fill #9

## 2022-02-26 NOTE — Patient Instructions (Signed)
Medication Instructions:   Your physician recommends that you continue on your current medications as directed. Please refer to the Current Medication list given to you today.   *If you need a refill on your cardiac medications before your next appointment, please call your pharmacy*   Lab Work:  TODAY!!!! CMET/LIPID !!!  If you have labs (blood work) drawn today and your tests are completely normal, you will receive your results only by: MyChart Message (if you have MyChart) OR A paper copy in the mail If you have any lab test that is abnormal or we need to change your treatment, we will call you to review the results.   Testing/Procedures:  Your physician has requested that you have an echocardiogram. Echocardiography is a painless test that uses sound waves to create images of your heart. It provides your doctor with information about the size and shape of your heart and how well your heart's chambers and valves are working. This procedure takes approximately one hour. There are no restrictions for this procedure. Please do NOT wear cologne, perfume, aftershave, or lotions (deodorant is allowed). Please arrive 15 minutes prior to your appointment time.    Follow-Up: At [facility], you and your health needs are our priority.  As part of our continuing mission to provide you with exceptional heart care, we have created designated Provider Care Teams.  These Care Teams include your primary Cardiologist (physician) and Advanced Practice Providers (APPs -  Physician Assistants and Nurse Practitioners) who all work together to provide you with the care you need, when you need it.  We recommend signing up for the patient portal called "MyChart".  Sign up information is provided on this After Visit Summary.  MyChart is used to connect with patients for Virtual Visits (Telemedicine).  Patients are able to view lab/test results, encounter notes, upcoming appointments, etc.   Non-urgent messages can be sent to your provider as well.   To learn more about what you can do with MyChart, go to [email or web address].    Your next appointment:   6 month(s)  The format for your next appointment:   In Person  Provider:   Peter [place], MD     Important Information About Sugar

## 2022-03-02 ENCOUNTER — Other Ambulatory Visit: Payer: Self-pay

## 2022-03-02 ENCOUNTER — Other Ambulatory Visit: Payer: Self-pay | Admitting: *Deleted

## 2022-03-02 DIAGNOSIS — E785 Hyperlipidemia, unspecified: Secondary | ICD-10-CM

## 2022-03-02 DIAGNOSIS — Z79899 Other long term (current) drug therapy: Secondary | ICD-10-CM

## 2022-03-02 MED ORDER — ROSUVASTATIN CALCIUM 5 MG PO TABS
5.0000 mg | ORAL_TABLET | Freq: Every day | ORAL | 3 refills | Status: DC
  Filled 2022-03-02: qty 30, 30d supply, fill #0
  Filled 2022-04-04: qty 30, 30d supply, fill #1
  Filled 2022-05-10: qty 30, 30d supply, fill #2
  Filled 2022-08-16: qty 30, 30d supply, fill #3
  Filled 2022-09-27: qty 30, 30d supply, fill #4
  Filled 2022-11-02: qty 30, 30d supply, fill #5
  Filled 2022-12-07: qty 30, 30d supply, fill #6
  Filled 2023-01-15: qty 30, 30d supply, fill #7
  Filled 2023-02-15: qty 30, 30d supply, fill #8

## 2022-03-29 ENCOUNTER — Other Ambulatory Visit (HOSPITAL_COMMUNITY): Payer: Self-pay

## 2022-04-05 ENCOUNTER — Other Ambulatory Visit: Payer: Self-pay

## 2022-04-23 ENCOUNTER — Other Ambulatory Visit: Payer: Self-pay

## 2022-05-09 ENCOUNTER — Other Ambulatory Visit: Payer: Self-pay

## 2022-05-11 ENCOUNTER — Encounter (HOSPITAL_COMMUNITY): Payer: Self-pay | Admitting: Nurse Practitioner

## 2022-05-11 ENCOUNTER — Other Ambulatory Visit: Payer: Self-pay

## 2022-05-23 ENCOUNTER — Other Ambulatory Visit (HOSPITAL_COMMUNITY): Payer: Self-pay

## 2022-05-28 ENCOUNTER — Ambulatory Visit: Payer: Self-pay | Attending: Nurse Practitioner

## 2022-05-29 ENCOUNTER — Other Ambulatory Visit: Payer: Self-pay

## 2022-08-10 NOTE — Progress Notes (Deleted)
Cardiology Office Note:    Date:  08/10/2022   ID:  Alex Liu, DOB Apr 20, 1965, MRN [id number]  PCP:  Aviva Kluver    [facility] Providers Cardiologist:  Dylana Shaw [place], MD { Click to update primary MD,subspecialty MD or APP then REFRESH:1}    Referring MD: No ref. provider found   No chief complaint on file. ***  History of Present Illness:    Alex Liu is a 57 y.o. male with a hx of  hypertensive heart disease with diastolic congestive heart failure, HTN, pulmonary edema, polysubstance abuse, mesenteric artery stenosis, CKD stage III. Seen today for follow up. Last seen by me in 2019 but seen by our APPs in 2022 and 2023. Admitted 02/2017 with worsening shortness of breath.  His UDS was positive for cocaine. Hs troponin was elevated at 0.08.  BNP was 204.1, hemoglobin was normal, potassium was 3.4.  He was treated for hypertensive urgency and was not a candidate for beta blocking medication due to cocaine use.  He was diagnosed with acute pulmonary edema and treated with IV diuresis.  His echocardiogram 02/25/2017 showed an EF of 50 to 55%, G1 DD, akinesis of the mid to apical inferior septal myocardium, mild aortic insufficiency, mild dilated left and right atrium.  Due to his lack of anginal symptoms no ischemic evaluation was completed.  His renal Doppler showed less than 60% renal artery stenosis bilaterally.  On follow up he was doing OK. When seen in Dec 2023 repeat Echo ordered but never completed.     Past Medical History:  Diagnosis Date   Hypertension    Hypertensive heart disease with acute diastolic congestive heart failure [facility])     Past Surgical History:  Procedure Laterality Date   BRAIN SURGERY      Current Medications: No outpatient medications have been marked as taking for the 08/13/22 encounter (Appointment) with [place], Tonyetta Berko M, MD.     Allergies:   Patient has no known allergies.   Social History   Socioeconomic History   Marital status:  Married    Spouse name: Not on file   Number of children: Not on file   Years of education: Not on file   Highest education level: Not on file  Occupational History   Not on file  Tobacco Use   Smoking status: Heavy Smoker    Packs/day: 1    Types: Cigars, Cigarettes   Smokeless tobacco: Never  Vaping Use   Vaping Use: Never used  Substance and Sexual Activity   Alcohol use: Yes   Drug use: Yes    Types: Cocaine   Sexual activity: Not on file  Other Topics Concern   Not on file  Social History Narrative   Not on file   Social Determinants of Health   Financial Resource Strain: Not on file  Food Insecurity: Not on file  Transportation Needs: Not on file  Physical Activity: Not on file  Stress: Not on file  Social Connections: Not on file     Family History: The patient's ***family history includes CAD in his brother; CAD (age of onset: 72) in his brother; Coronary artery disease in his brother; Hypertension in his mother.  ROS:   Please see the history of present illness.    *** All other systems reviewed and are negative.  EKGs/Labs/Other Studies Reviewed:    The following studies were reviewed today: Renal Artery [place] 03/01/17 Renal:    Right: Less than 60% stenosis of the right  renal artery.  Left:  Less than 60% stenosis of the left renal artery.  Mesenteric:  Velocities suggest 70 to 99% stenosis in the superior mesenteric artery,  however  visualization of the SMA was not optimal.   Echo 02/25/17 Left ventricle: The cavity size was normal. There was mild    concentric hypertrophy. Systolic function was normal. The    estimated ejection fraction was in the range of 50% to 55%. There    is akinesis of the mid-apicalinferoseptal myocardium. Doppler    parameters are consistent with abnormal left ventricular    relaxation (grade 1 diastolic dysfunction).  - Aortic valve: There was mild regurgitation.  - Left atrium: The atrium was mildly dilated.  - Right  atrium: The atrium was mildly dilated.  - Tricuspid valve: There was trivial regurgitation.    EKG:  EKG is *** ordered today.  The ekg ordered today demonstrates ***  Recent Labs: 02/26/2022: ALT 16; BUN 12; Creatinine, Ser 1.14; Potassium 4.5; Sodium 139  Recent Lipid Panel    Component Value Date/Time   CHOL 180 02/26/2022 1007   TRIG 127 02/26/2022 1007   HDL 45 02/26/2022 1007   CHOLHDL 4.0 02/26/2022 1007   CHOLHDL 4.7 02/25/2017 0040   VLDL 15 02/25/2017 0040   LDLCALC 112 (H) 02/26/2022 1007     Risk Assessment/Calculations:   {Does this patient have ATRIAL FIBRILLATION?:[phone]}  No BP recorded.  {Refresh Note OR Click here to enter BP  :1}***         Physical Exam:    VS:  There were no vitals taken for this visit.    Wt Readings from Last 3 Encounters:  02/26/22 233 lb 3.2 oz (105.8 kg)  10/20/20 232 lb 3.2 oz (105.3 kg)  12/30/18 239 lb (108.4 kg)     GEN: *** Well nourished, well developed in no acute distress HEENT: Normal NECK: No JVD; No carotid bruits LYMPHATICS: No lymphadenopathy CARDIAC: ***RRR, no murmurs, rubs, gallops RESPIRATORY:  Clear to auscultation without rales, wheezing or rhonchi  ABDOMEN: Soft, non-tender, non-distended MUSCULOSKELETAL:  No edema; No deformity  SKIN: Warm and dry NEUROLOGIC:  Alert and oriented x 3 PSYCHIATRIC:  Normal affect   ASSESSMENT:    No diagnosis found. PLAN:    In order of problems listed above:  ***      {Are you ordering a CV Procedure (e.g. stress test, cath, DCCV, TEE, etc)?   Press F2        :[id number]}    Medication Adjustments/Labs and Tests Ordered: Current medicines are reviewed at length with the patient today.  Concerns regarding medicines are outlined above.  No orders of the defined types were placed in this encounter.  No orders of the defined types were placed in this encounter.   There are no Patient Instructions on file for this visit.   Signed, Nyshawn Gowdy [place], MD   08/10/2022 3:32 PM    [facility]

## 2022-08-13 ENCOUNTER — Ambulatory Visit: Payer: Managed Care, Other (non HMO) | Attending: Cardiology | Admitting: Cardiology

## 2022-08-16 ENCOUNTER — Other Ambulatory Visit: Payer: Self-pay

## 2022-08-31 ENCOUNTER — Other Ambulatory Visit: Payer: Self-pay

## 2022-09-27 ENCOUNTER — Other Ambulatory Visit: Payer: Self-pay

## 2022-11-02 ENCOUNTER — Other Ambulatory Visit: Payer: Self-pay

## 2022-11-02 ENCOUNTER — Other Ambulatory Visit: Payer: Self-pay | Admitting: Nurse Practitioner

## 2022-11-02 MED ORDER — HYDRALAZINE HCL 100 MG PO TABS
100.0000 mg | ORAL_TABLET | Freq: Three times a day (TID) | ORAL | 1 refills | Status: DC
  Filled 2022-11-02: qty 90, 30d supply, fill #0
  Filled 2023-01-15: qty 90, 30d supply, fill #1

## 2022-12-10 ENCOUNTER — Other Ambulatory Visit: Payer: Self-pay

## 2023-01-15 ENCOUNTER — Other Ambulatory Visit: Payer: Self-pay

## 2023-02-15 ENCOUNTER — Other Ambulatory Visit: Payer: Self-pay

## 2023-02-25 ENCOUNTER — Other Ambulatory Visit: Payer: Self-pay

## 2023-02-25 ENCOUNTER — Other Ambulatory Visit: Payer: Self-pay | Admitting: Cardiology

## 2023-02-25 MED ORDER — HYDRALAZINE HCL 100 MG PO TABS
100.0000 mg | ORAL_TABLET | Freq: Three times a day (TID) | ORAL | 0 refills | Status: DC
  Filled 2023-02-25 – 2023-05-07 (×2): qty 90, 30d supply, fill #0

## 2023-03-05 ENCOUNTER — Other Ambulatory Visit: Payer: Self-pay

## 2023-05-07 ENCOUNTER — Other Ambulatory Visit: Payer: Self-pay | Admitting: Nurse Practitioner

## 2023-05-07 ENCOUNTER — Telehealth: Payer: Self-pay | Admitting: Cardiology

## 2023-05-07 ENCOUNTER — Other Ambulatory Visit: Payer: Self-pay

## 2023-05-07 MED ORDER — HYDRALAZINE HCL 100 MG PO TABS
100.0000 mg | ORAL_TABLET | Freq: Three times a day (TID) | ORAL | 0 refills | Status: DC
  Filled 2023-05-07: qty 270, 90d supply, fill #0

## 2023-05-07 MED ORDER — LABETALOL HCL 300 MG PO TABS
300.0000 mg | ORAL_TABLET | Freq: Two times a day (BID) | ORAL | 0 refills | Status: DC
  Filled 2023-05-07: qty 180, 90d supply, fill #0

## 2023-05-07 MED ORDER — ROSUVASTATIN CALCIUM 5 MG PO TABS
5.0000 mg | ORAL_TABLET | Freq: Every day | ORAL | 0 refills | Status: DC
  Filled 2023-05-07: qty 90, 90d supply, fill #0

## 2023-05-07 MED ORDER — ISOSORBIDE MONONITRATE ER 60 MG PO TB24
60.0000 mg | ORAL_TABLET | Freq: Every day | ORAL | 0 refills | Status: DC
  Filled 2023-05-07: qty 90, 90d supply, fill #0

## 2023-05-07 MED ORDER — SPIRONOLACTONE 25 MG PO TABS
25.0000 mg | ORAL_TABLET | Freq: Every day | ORAL | 0 refills | Status: DC
  Filled 2023-05-07: qty 90, 90d supply, fill #0

## 2023-05-07 NOTE — Telephone Encounter (Signed)
*  STAT* If patient is at the pharmacy, call can be transferred to refill team.   1. Which medications need to be refilled? (please list name of each medication and dose if known)   hydrALAZINE (APRESOLINE) 100 MG tablet  labetalol (NORMODYNE) 300 MG tablet (Expired)  rosuvastatin (CRESTOR) 5 MG tablet (Expired)  spironolactone (ALDACTONE) 25 MG tablet   2. Would you like to learn more about the convenience, safety, & potential cost savings by using the [facility]?   3. Are you open to using the [facility] (Type [facility]. ).  4. Which pharmacy/location (including street and city if local pharmacy) is medication to be sent to?  [facility]   5. Do they need a 30 day or 90 day supply?   90 day  Wife Zella Ball) stated patient is running out of these medications.  Patient has appointment scheduled on 5/16.

## 2023-05-07 NOTE — Telephone Encounter (Signed)
 Pt's medications were sent to pt's pharmacy as requested. Confirmation received.

## 2023-07-23 NOTE — Progress Notes (Unsigned)
 Cardiology Office Note:    Date:  07/26/2023   ID:  Alex Liu, DOB 1965/10/23, MRN [id number]  PCP:  Juvenal Opoka   [facility] Providers Cardiologist:  Joyclyn Plazola [place], MD     Referring MD: No ref. provider found   Chief Complaint: follow-up hypertension  History of Present Illness:    Alex Liu is a pleasant 58 y.o. 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 Last seen in our office Dec 2023  Admitted 02/2017 with worsening shortness of breath.  Reported he was on his way to get his inhaler and lost consciousness, was found by bystander who contacted EMS.  EMS gave him Narcan in the field due to concern with opiate overdose.  On arrival to ED he was severely hypertensive 211/42.  His UDS was positive for cocaine. Hs troponin was elevated at 0.08.  BNP was 204.1, hemoglobin was normal, potassium was 3.4.  He was treated for hypertensive urgency and was not a candidate for beta blocking medication due to cocaine use.  He was diagnosed with acute pulmonary edema and treated with IV diuresis.  His echocardiogram 02/25/2017 showed an EF of 50 to 55%, G1 DD, akinesis of the mid to apical inferior septal myocardium, mild aortic insufficiency, mild dilated left and right atrium.  Due to his lack of anginal symptoms no ischemic evaluation was completed.  His renal Doppler showed less than 60% renal artery stenosis bilaterally.  Was seen  07/2017 and was doing well at that time.  His furosemide  was discontinued because he was euvolemic.    When last seen still asymptomatic. Noted T wave changes on Ecg. Echo ordered but never done.   On follow up today he is doing well. Has gained 13 lbs. Still smoking at least 2 cigars a day. No drug use. Tries to eat healthy. Stays active. Maybe a little SOB when doing yard work. BP has been OK.   Past Medical History:   Diagnosis Date   Hypertension    Hypertensive heart disease with acute diastolic congestive heart failure (HCC)     Past Surgical History:  Procedure Laterality Date   BRAIN SURGERY      Current Medications: Current Meds  Medication Sig   aspirin  EC 81 MG tablet Take 1 tablet (81 mg total) by mouth daily.   Misc. Devices MISC CPAP   nicotine  (NICODERM CQ  - DOSED IN MG/24 HOURS) 14 mg/24hr patch Place 1 patch (14 mg total) onto the skin daily. FILL RX AFTER 21MG  PATCH   nicotine  (NICODERM CQ  - DOSED IN MG/24 HOURS) 21 mg/24hr patch Place 1 patch (21 mg total) onto the skin daily. Fill this rx first   nicotine  (NICODERM CQ  - DOSED IN MG/24 HR) 7 mg/24hr patch Place 1 patch (7 mg total) onto the skin daily.   [DISCONTINUED] hydrALAZINE  (APRESOLINE ) 100 MG tablet Take 1 tablet (100 mg total) by mouth every 8 (eight) hours. Pt needs to schedule appt with provider for further refills - 1st attempt   [DISCONTINUED] hydrALAZINE  (APRESOLINE ) 100 MG tablet Take 1 tablet (100 mg total) by mouth every 8 (eight) hours.   [DISCONTINUED] isosorbide  mononitrate (IMDUR ) 60 MG 24 hr tablet Take 1 tablet (60 mg total) by mouth daily. Please schedule appointment for future refills. Thank you   [DISCONTINUED] labetalol  (NORMODYNE ) 300 MG tablet Take 1 tablet (300 mg total) by mouth 2 (two) times  daily.   [DISCONTINUED] rosuvastatin  (CRESTOR ) 5 MG tablet Take 1 tablet (5 mg total) by mouth daily.   [DISCONTINUED] spironolactone  (ALDACTONE ) 25 MG tablet Take 1 tablet (25 mg total) by mouth daily.     Allergies:   Patient has no known allergies.   Social History   Socioeconomic History   Marital status: Married    Spouse name: Not on file   Number of children: Not on file   Years of education: Not on file   Highest education level: Not on file  Occupational History   Not on file  Tobacco Use   Smoking status: Heavy Smoker    Current packs/day: 1.00    Types: Cigars, Cigarettes   Smokeless  tobacco: Never  Vaping Use   Vaping status: Never Used  Substance and Sexual Activity   Alcohol use: Yes   Drug use: Yes    Types: Cocaine   Sexual activity: Not on file  Other Topics Concern   Not on file  Social History Narrative   Not on file   Social Drivers of Health   Financial Resource Strain: Not on file  Food Insecurity: Not on file  Transportation Needs: Not on file  Physical Activity: Not on file  Stress: Not on file  Social Connections: Not on file     Family History: The patient's family history includes CAD in his brother; CAD (age of onset: 29) in his brother; Coronary artery disease in his brother; Hypertension in his mother.  ROS:   Please see the history of present illness.   All other systems reviewed and are negative.  Labs/Other Studies Reviewed:    The following studies were reviewed today:  Renal Artery US  03/01/17 Renal:    Right: Less than 60% stenosis of the right renal artery.  Left:  Less than 60% stenosis of the left renal artery.  Mesenteric:  Velocities suggest 70 to 99% stenosis in the superior mesenteric artery,  however  visualization of the SMA was not optimal.  Echo 02/25/17 Left ventricle: The cavity size was normal. There was mild    concentric hypertrophy. Systolic function was normal. The    estimated ejection fraction was in the range of 50% to 55%. There    is akinesis of the mid-apicalinferoseptal myocardium. Doppler    parameters are consistent with abnormal left ventricular    relaxation (grade 1 diastolic dysfunction).  - Aortic valve: There was mild regurgitation.  - Left atrium: The atrium was mildly dilated.  - Right atrium: The atrium was mildly dilated.  - Tricuspid valve: There was trivial regurgitation.   Recent Labs: No results found for requested labs within last 365 days.  Recent Lipid Panel    Component Value Date/Time   CHOL 180 02/26/2022 1007   TRIG 127 02/26/2022 1007   HDL 45 02/26/2022 1007    CHOLHDL 4.0 02/26/2022 1007   CHOLHDL 4.7 02/25/2017 0040   VLDL 15 02/25/2017 0040   LDLCALC 112 (H) 02/26/2022 1007     Risk Assessment/Calculations:       Physical Exam:    VS:  BP 132/88   Pulse 67   Ht 6' (1.829 m)   Wt 246 lb 12.8 oz (111.9 kg)   SpO2 95%   BMI 33.47 kg/m     Wt Readings from Last 3 Encounters:  07/26/23 246 lb 12.8 oz (111.9 kg)  02/26/22 233 lb 3.2 oz (105.8 kg)  10/20/20 232 lb 3.2 oz (105.3 kg)  GEN:  Well nourished, well developed in no acute distress HEENT: Normal NECK: No JVD; No carotid bruits CARDIAC: RRR, no murmurs, rubs, gallops RESPIRATORY:  Clear to auscultation without rales, wheezing or rhonchi  ABDOMEN: Soft, non-tender, non-distended MUSCULOSKELETAL:  No edema; No deformity. 2+ pedal pulses, equal bilaterally SKIN: Warm and dry NEUROLOGIC:  Alert and oriented x 3 PSYCHIATRIC:  Normal affect   EKG Interpretation Date/Time:  Friday Jul 26 2023 09:19:32 EDT Ventricular Rate:  64 PR Interval:  190 QRS Duration:  92 QT Interval:  382 QTC Calculation: 394 R Axis:   50  Text Interpretation: Normal sinus rhythm Normal ECG When compared with ECG of Feb 26, 2022 T wave inversion in inferior leads is no longer present Confirmed by [place], Heavenly Christine [phone]) on 07/26/2023 9:27:40 AM       Diagnoses:    1. Hyperlipidemia, unspecified hyperlipidemia type   2. Essential hypertension     Assessment and Plan:      Hypertensive heart disease: BP is controlled on multiple medications. Will continue current therapy and Rx refilled.   Chronic combined CHF: LVEF 50-55%, G1DD, mild LVH, akinesis of mid-apicalinferoseptal myocardium on echo 02/2017.  Minimal dyspnea on exertion. No orthopnea, PND, edema. Ecg today is normal.   Hyperlipidemia: No recent lipid panel to review. Will update labs today  Tobacco abuse: encourage complete smoking cessation. He is going to try nicotine  patches.      Disposition: one year with  APP  Medication Adjustments/Labs and Tests Ordered: Current medicines are reviewed at length with the patient today.  Concerns regarding medicines are outlined above.  Orders Placed This Encounter  Procedures   Comp Met (CMET)   Lipid panel   CBC w/Diff   EKG 12-Lead   Meds ordered this encounter  Medications   hydrALAZINE  (APRESOLINE ) 100 MG tablet    Sig: Take 1 tablet (100 mg total) by mouth 3 (three) times daily. Pt needs to schedule appt with provider for further refills - 1st attempt    Dispense:  270 tablet    Refill:  3    Please call our office to schedule your yearly appointment for future refills. 1st attempt   spironolactone  (ALDACTONE ) 25 MG tablet    Sig: Take 1 tablet (25 mg total) by mouth daily.    Dispense:  90 tablet    Refill:  3    Pt must keep upcoming appt in May 2025 with Dr. [place] before anymore refills. Thank you Final Attempt   labetalol  (NORMODYNE ) 300 MG tablet    Sig: Take 1 tablet (300 mg total) by mouth 2 (two) times daily.    Dispense:  180 tablet    Refill:  3    Pt must keep upcoming appt in May 2025 with Dr. [place] before anymore refills. Thank you Final Attempt   isosorbide  mononitrate (IMDUR ) 60 MG 24 hr tablet    Sig: Take 1 tablet (60 mg total) by mouth daily. Please schedule appointment for future refills. Thank you    Dispense:  90 tablet    Refill:  3   rosuvastatin  (CRESTOR ) 5 MG tablet    Sig: Take 1 tablet (5 mg total) by mouth daily.    Dispense:  90 tablet    Refill:  3    Pt must keep upcoming appt in May 2025 with Dr. [place] before anymore refills. Thank you Final Attempt    There are no Patient Instructions on file for this visit.   Signed, Kacie Huxtable [place],  MD  07/26/2023 9:37 AM    [facility]

## 2023-07-26 ENCOUNTER — Other Ambulatory Visit (HOSPITAL_COMMUNITY): Payer: Self-pay

## 2023-07-26 ENCOUNTER — Other Ambulatory Visit: Payer: Self-pay

## 2023-07-26 ENCOUNTER — Encounter: Payer: Self-pay | Admitting: Cardiology

## 2023-07-26 ENCOUNTER — Ambulatory Visit: Payer: Managed Care, Other (non HMO) | Attending: Cardiology | Admitting: Cardiology

## 2023-07-26 VITALS — BP 132/88 | HR 67 | Ht 72.0 in | Wt 246.8 lb

## 2023-07-26 DIAGNOSIS — E785 Hyperlipidemia, unspecified: Secondary | ICD-10-CM

## 2023-07-26 DIAGNOSIS — I10 Essential (primary) hypertension: Secondary | ICD-10-CM

## 2023-07-26 DIAGNOSIS — I5042 Chronic combined systolic (congestive) and diastolic (congestive) heart failure: Secondary | ICD-10-CM

## 2023-07-26 MED ORDER — LABETALOL HCL 300 MG PO TABS
300.0000 mg | ORAL_TABLET | Freq: Two times a day (BID) | ORAL | 3 refills | Status: AC
  Filled 2023-07-26 (×2): qty 180, 90d supply, fill #0
  Filled 2023-07-29: qty 60, 30d supply, fill #0
  Filled 2023-09-16: qty 60, 30d supply, fill #1
  Filled 2023-10-27: qty 60, 30d supply, fill #2
  Filled 2023-12-13: qty 60, 30d supply, fill #3
  Filled 2024-01-27: qty 60, 30d supply, fill #4
  Filled 2024-03-19 (×2): qty 60, 30d supply, fill #5

## 2023-07-26 MED ORDER — ROSUVASTATIN CALCIUM 5 MG PO TABS
5.0000 mg | ORAL_TABLET | Freq: Every day | ORAL | 3 refills | Status: DC
  Filled 2023-07-26 (×2): qty 90, 90d supply, fill #0

## 2023-07-26 MED ORDER — ISOSORBIDE MONONITRATE ER 60 MG PO TB24
60.0000 mg | ORAL_TABLET | Freq: Every day | ORAL | 3 refills | Status: AC
  Filled 2023-07-26 (×2): qty 90, 90d supply, fill #0
  Filled 2023-07-29: qty 30, 30d supply, fill #0
  Filled 2023-09-16: qty 30, 30d supply, fill #1
  Filled 2023-10-27: qty 30, 30d supply, fill #2
  Filled 2023-12-03 (×2): qty 30, 30d supply, fill #3
  Filled 2024-01-07 (×2): qty 30, 30d supply, fill #4
  Filled 2024-02-03: qty 30, 30d supply, fill #5
  Filled 2024-03-02 (×2): qty 30, 30d supply, fill #6
  Filled 2024-04-08 (×2): qty 30, 30d supply, fill #7

## 2023-07-26 MED ORDER — SPIRONOLACTONE 25 MG PO TABS
25.0000 mg | ORAL_TABLET | Freq: Every day | ORAL | 3 refills | Status: AC
  Filled 2023-07-26 (×2): qty 90, 90d supply, fill #0
  Filled 2023-07-29: qty 30, 30d supply, fill #0
  Filled 2023-09-16 (×2): qty 30, 30d supply, fill #1
  Filled 2023-10-27 – 2023-10-28 (×2): qty 30, 30d supply, fill #2
  Filled 2023-12-03 (×2): qty 30, 30d supply, fill #3
  Filled 2024-01-07 (×2): qty 30, 30d supply, fill #4
  Filled 2024-02-03 (×2): qty 30, 30d supply, fill #5
  Filled 2024-03-02 (×2): qty 30, 30d supply, fill #6
  Filled 2024-04-08 (×2): qty 30, 30d supply, fill #7

## 2023-07-26 MED ORDER — HYDRALAZINE HCL 100 MG PO TABS
100.0000 mg | ORAL_TABLET | Freq: Three times a day (TID) | ORAL | 3 refills | Status: AC
  Filled 2023-07-26 (×2): qty 270, 90d supply, fill #0
  Filled 2023-07-29: qty 90, 30d supply, fill #0
  Filled 2023-09-27: qty 90, 30d supply, fill #1
  Filled 2023-11-21: qty 90, 30d supply, fill #2
  Filled 2024-01-22: qty 90, 30d supply, fill #3
  Filled 2024-03-19 (×3): qty 90, 30d supply, fill #4

## 2023-07-26 NOTE — Patient Instructions (Signed)
 Medication Instructions:  Continue all medications *If you need a refill on your cardiac medications before your next appointment, please call your pharmacy*  Lab Work: Cmet.lipid panel,cbc today  Testing/Procedures: None ordered  Follow-Up: At [facility], you and your health needs are our priority.  As part of our continuing mission to provide you with exceptional heart care, our providers are all part of one team.  This team includes your primary Cardiologist (physician) and Advanced Practice Providers or APPs (Physician Assistants and Nurse Practitioners) who all work together to provide you with the care you need, when you need it.  Your next appointment:  1 year   Call in Feb to schedule May appointment     Provider:  Dr.Jordan's NP   Marlana Silvan NP   We recommend signing up for the patient portal called "MyChart".  Sign up information is provided on this After Visit Summary.  MyChart is used to connect with patients for Virtual Visits (Telemedicine).  Patients are able to view lab/test results, encounter notes, upcoming appointments, etc.  Non-urgent messages can be sent to your provider as well.   To learn more about what you can do with MyChart, go to [email or web address].

## 2023-07-27 LAB — CBC WITH DIFFERENTIAL/PLATELET
Basophils Absolute: 0 10*3/uL (ref 0.0–0.2)
Basos: 1 %
EOS (ABSOLUTE): 0.1 10*3/uL (ref 0.0–0.4)
Eos: 1 %
Hematocrit: 45.1 % (ref 37.5–51.0)
Hemoglobin: 15.3 g/dL (ref 13.0–17.7)
Immature Grans (Abs): 0 10*3/uL (ref 0.0–0.1)
Immature Granulocytes: 0 %
Lymphocytes Absolute: 2.4 10*3/uL (ref 0.7–3.1)
Lymphs: 42 %
MCH: 31 pg (ref 26.6–33.0)
MCHC: 33.9 g/dL (ref 31.5–35.7)
MCV: 91 fL (ref 79–97)
Monocytes Absolute: 0.6 10*3/uL (ref 0.1–0.9)
Monocytes: 11 %
Neutrophils Absolute: 2.5 10*3/uL (ref 1.4–7.0)
Neutrophils: 45 %
Platelets: 273 10*3/uL (ref 150–450)
RBC: 4.94 x10E6/uL (ref 4.14–5.80)
RDW: 14 % (ref 11.6–15.4)
WBC: 5.6 10*3/uL (ref 3.4–10.8)

## 2023-07-27 LAB — COMPREHENSIVE METABOLIC PANEL WITH GFR
ALT: 21 IU/L (ref 0–44)
AST: 17 IU/L (ref 0–40)
Albumin: 4.3 g/dL (ref 3.8–4.9)
Alkaline Phosphatase: 120 IU/L (ref 44–121)
BUN/Creatinine Ratio: 14 (ref 9–20)
BUN: 16 mg/dL (ref 6–24)
Bilirubin Total: 0.2 mg/dL (ref 0.0–1.2)
CO2: 21 mmol/L (ref 20–29)
Calcium: 9.5 mg/dL (ref 8.7–10.2)
Chloride: 103 mmol/L (ref 96–106)
Creatinine, Ser: 1.17 mg/dL (ref 0.76–1.27)
Globulin, Total: 2.3 g/dL (ref 1.5–4.5)
Glucose: 87 mg/dL (ref 70–99)
Potassium: 4.9 mmol/L (ref 3.5–5.2)
Sodium: 141 mmol/L (ref 134–144)
Total Protein: 6.6 g/dL (ref 6.0–8.5)
eGFR: 73 mL/min/{1.73_m2} (ref >59)

## 2023-07-27 LAB — LIPID PANEL
Chol/HDL Ratio: 3.4 ratio (ref 0.0–5.0)
Cholesterol, Total: 151 mg/dL (ref 100–199)
HDL: 44 mg/dL (ref >39)
LDL Chol Calc (NIH): 87 mg/dL (ref 0–99)
Triglycerides: 109 mg/dL (ref 0–149)
VLDL Cholesterol Cal: 20 mg/dL (ref 5–40)

## 2023-07-29 ENCOUNTER — Other Ambulatory Visit (HOSPITAL_COMMUNITY): Payer: Self-pay

## 2023-07-29 ENCOUNTER — Other Ambulatory Visit: Payer: Self-pay

## 2023-07-29 ENCOUNTER — Ambulatory Visit: Payer: Self-pay | Admitting: Cardiology

## 2023-07-29 DIAGNOSIS — E785 Hyperlipidemia, unspecified: Secondary | ICD-10-CM

## 2023-07-29 MED ORDER — ROSUVASTATIN CALCIUM 10 MG PO TABS
10.0000 mg | ORAL_TABLET | Freq: Every day | ORAL | 3 refills | Status: AC
  Filled 2023-07-29: qty 30, 30d supply, fill #0
  Filled 2023-07-29: qty 90, 90d supply, fill #0
  Filled 2023-09-16 (×2): qty 30, 30d supply, fill #1
  Filled 2023-12-03 (×2): qty 30, 30d supply, fill #2
  Filled 2024-01-07 (×2): qty 30, 30d supply, fill #3
  Filled 2024-02-03: qty 30, 30d supply, fill #4
  Filled 2024-03-02 (×2): qty 30, 30d supply, fill #5
  Filled 2024-04-08 (×3): qty 30, 30d supply, fill #6

## 2023-09-16 ENCOUNTER — Other Ambulatory Visit: Payer: Self-pay

## 2023-09-16 ENCOUNTER — Other Ambulatory Visit (HOSPITAL_COMMUNITY): Payer: Self-pay

## 2023-09-17 ENCOUNTER — Other Ambulatory Visit: Payer: Self-pay

## 2023-09-27 ENCOUNTER — Other Ambulatory Visit: Payer: Self-pay

## 2023-10-28 ENCOUNTER — Other Ambulatory Visit: Payer: Self-pay

## 2023-10-30 ENCOUNTER — Other Ambulatory Visit: Payer: Self-pay

## 2023-11-21 ENCOUNTER — Other Ambulatory Visit: Payer: Self-pay

## 2023-12-03 ENCOUNTER — Other Ambulatory Visit: Payer: Self-pay

## 2023-12-04 ENCOUNTER — Other Ambulatory Visit: Payer: Self-pay

## 2023-12-05 ENCOUNTER — Other Ambulatory Visit: Payer: Self-pay

## 2023-12-13 ENCOUNTER — Other Ambulatory Visit: Payer: Self-pay

## 2023-12-16 ENCOUNTER — Other Ambulatory Visit: Payer: Self-pay

## 2024-01-07 ENCOUNTER — Other Ambulatory Visit: Payer: Self-pay

## 2024-01-08 ENCOUNTER — Other Ambulatory Visit: Payer: Self-pay

## 2024-01-22 ENCOUNTER — Other Ambulatory Visit: Payer: Self-pay

## 2024-01-27 ENCOUNTER — Other Ambulatory Visit: Payer: Self-pay

## 2024-02-03 ENCOUNTER — Other Ambulatory Visit: Payer: Self-pay

## 2024-03-02 ENCOUNTER — Other Ambulatory Visit: Payer: Self-pay

## 2024-03-06 ENCOUNTER — Other Ambulatory Visit: Payer: Self-pay

## 2024-03-19 ENCOUNTER — Other Ambulatory Visit: Payer: Self-pay

## 2024-04-08 ENCOUNTER — Other Ambulatory Visit: Payer: Self-pay

## 2024-04-14 ENCOUNTER — Other Ambulatory Visit: Payer: Self-pay
